# Patient Record
Sex: Male | Born: 1970 | Race: White | Hispanic: No | Marital: Married | Smoking: Never smoker
Health system: Southern US, Community
[De-identification: ages and names within clinical notes are randomized; demographics above are authoritative.]

## PROBLEM LIST (undated history)

## (undated) DIAGNOSIS — N182 Chronic kidney disease, stage 2 (mild): Secondary | ICD-10-CM

## (undated) DIAGNOSIS — C189 Malignant neoplasm of colon, unspecified: Secondary | ICD-10-CM

## (undated) DIAGNOSIS — I10 Essential (primary) hypertension: Secondary | ICD-10-CM

## (undated) DIAGNOSIS — E119 Type 2 diabetes mellitus without complications: Secondary | ICD-10-CM

---

## 2009-08-19 ENCOUNTER — Emergency Department: Payer: Self-pay | Admitting: Internal Medicine

## 2013-01-10 ENCOUNTER — Ambulatory Visit: Payer: Self-pay | Admitting: Physician Assistant

## 2013-01-15 ENCOUNTER — Ambulatory Visit: Payer: Self-pay

## 2013-07-19 ENCOUNTER — Ambulatory Visit: Payer: Self-pay | Admitting: Emergency Medicine

## 2013-07-19 LAB — URINALYSIS, COMPLETE
BACTERIA: NEGATIVE
BILIRUBIN, UR: NEGATIVE
BLOOD: NEGATIVE
GLUCOSE, UR: NEGATIVE mg/dL (ref 0–75)
KETONE: NEGATIVE
Leukocyte Esterase: NEGATIVE
NITRITE: NEGATIVE
Ph: 6 (ref 4.5–8.0)
Protein: NEGATIVE
SPECIFIC GRAVITY: 1.015 (ref 1.003–1.030)
Squamous Epithelial: NONE SEEN

## 2015-03-20 ENCOUNTER — Encounter: Payer: Self-pay | Admitting: Emergency Medicine

## 2015-03-20 ENCOUNTER — Ambulatory Visit (INDEPENDENT_AMBULATORY_CARE_PROVIDER_SITE_OTHER): Payer: BLUE CROSS/BLUE SHIELD

## 2015-03-20 ENCOUNTER — Ambulatory Visit
Admission: EM | Admit: 2015-03-20 | Discharge: 2015-03-20 | Disposition: A | Discharge status: Home / Self Care | Payer: BLUE CROSS/BLUE SHIELD | Attending: Family Medicine | Admitting: Family Medicine

## 2015-03-20 DIAGNOSIS — R030 Elevated blood-pressure reading, without diagnosis of hypertension: Secondary | ICD-10-CM | POA: Diagnosis not present

## 2015-03-20 DIAGNOSIS — S96912A Strain of unspecified muscle and tendon at ankle and foot level, left foot, initial encounter: Secondary | ICD-10-CM

## 2015-03-20 DIAGNOSIS — IMO0001 Reserved for inherently not codable concepts without codable children: Secondary | ICD-10-CM

## 2015-03-20 MED ORDER — HYDROCODONE-ACETAMINOPHEN 5-325 MG PO TABS: 1.0000 | ORAL_TABLET | Freq: Three times a day (TID) | ORAL | Status: DC | PRN

## 2015-03-20 MED ORDER — MELOXICAM 15 MG PO TABS: 15.0000 mg | ORAL_TABLET | Freq: Every day | ORAL | Status: DC

## 2015-03-20 NOTE — Discharge Instructions (Signed)
How to Take Your Blood Pressure HOW DO I GET A BLOOD PRESSURE MACHINE?  You can buy an electronic home blood pressure machine at your local pharmacy. Insurance will sometimes cover the cost if you have a prescription.  Ask your doctor what type of machine is best for you. There are different machines for your arm and your wrist.  If you decide to buy a machine to check your blood pressure on your arm, first check the size of your arm so you can buy the right size cuff. To check the size of your arm:   Use a measuring tape that shows both inches and centimeters.   Wrap the measuring tape around the upper-middle part of your arm. You may need someone to help you measure.   Write down your arm measurement in both inches and centimeters.   To measure your blood pressure correctly, it is important to have the right size cuff.   If your arm is up to 13 inches (up to 34 centimeters), get an adult cuff size.  If your arm is 13 to 17 inches (35 to 44 centimeters), get a large adult cuff size.    If your arm is 17 to 20 inches (45 to 52 centimeters), get an adult thigh cuff.  WHAT DO THE NUMBERS MEAN?   There are two numbers that make up your blood pressure. For example: 120/80.  The first number (120 in our example) is called the "systolic pressure." It is a measure of the pressure in your blood vessels when your heart is pumping blood.  The second number (80 in our example) is called the "diastolic pressure." It is a measure of the pressure in your blood vessels when your heart is resting between beats.  Your doctor will tell you what your blood pressure should be. WHAT SHOULD I DO BEFORE I CHECK MY BLOOD PRESSURE?   Try to rest or relax for at least 30 minutes before you check your blood pressure.  Do not smoke.  Do not have any drinks with caffeine, such as:  Soda.  Coffee.  Tea.  Check your blood pressure in a quiet room.  Sit down and stretch out your arm on a table.  Keep your arm at about the level of your heart. Let your arm relax.  Make sure that your legs are not crossed. HOW DO I CHECK MY BLOOD PRESSURE?  Follow the directions that came with your machine.  Make sure you remove any tight-fitting clothing from your arm or wrist. Wrap the cuff around your upper arm or wrist. You should be able to fit a finger between the cuff and your arm. If you cannot fit a finger between the cuff and your arm, it is too tight and should be removed and rewrapped.  Some units require you to manually pump up the arm cuff.  Automatic units inflate the cuff when you press a button.  Cuff deflation is automatic in both models.  After the cuff is inflated, the unit measures your blood pressure and pulse. The readings are shown on a monitor. Hold still and breathe normally while the cuff is inflated.  Getting a reading takes less than a minute.  Some models store readings in a memory. Some provide a printout of readings. If your machine does not store your readings, keep a written record.  Take readings with you to your next visit with your doctor.   This information is not intended to replace advice given to  you by your health care provider. Make sure you discuss any questions you have with your health care provider.   Document Released: 12/13/2007 Document Revised: 01/20/2014 Document Reviewed: 02/24/2013 Elsevier Interactive Patient Education 2016 Else[employer] Inc[employer]  Heart Disease Prevention Heart disease is a leading cause of death. There are many things you can do to help prevent heart disease. BE PHYSICALLY ACTIVE Physical activity is good for your heart. It helps control your blood pressure, cholesterol levels, and weight. Try to be physically active every day. Ask your health care provider what activities are best for you.  BE A HEALTHY WEIGHT Extra weight can strain your heart and affect your blood pressure and cholesterol levels. Lose weight with diet and  exercise if recommended by your health care provider. EAT HEART-HEALTHY FOODS Follow a healthy eating plan as recommended by your health care provider or dietitian. Heart-healthy foods include:   High-fiber foods. These include oat bran, oatmeal, and whole-grain breads and cereals.  Fruits and vegetables. Avoid:  Alcohol.  Fried foods.  Foods high in saturated fat. These include meats, butter, whole dairy products, shortening, and coconut or palm oil.  Salty foods. These include canned food, luncheon meat, salty snacks, and fast food. KEEP YOUR CHOLESTEROL LEVELS UNDER CONTROL Cholesterol is a substance that is used for many important functions. When your cholesterol levels are high, cholesterol can stick to the insides of your blood vessels, making them narrow or clog. This can lead to chest pain (angina) and a heart attack.  Keep your cholesterol levels under control as recommended by your health care provider. Have your cholesterol checked at least once a year. Target cholesterol levels (in mg/dL) for most people are:   Total cholesterol below 200.  LDL cholesterol below 100.  HDL cholesterol above 40 in men and above 50 in women.  Triglycerides below 150. KEEP YOUR BLOOD PRESSURE UNDER CONTROL Having high blood pressure (hypertension) puts you at risk for stroke and other forms of heart disease. Keep your blood pressure under control as recommended by your health care provider. Ask your health care provider if you need treatment to lower your blood pressure. If you are 67-41 years of age, have your blood pressure checked every 3-5 years. If you are 80 years of age or older, have your blood pressure checked every year. DO NOT USE TOBACCO PRODUCTS Tobacco smoke can damage your heart and blood vessels. Do not use any tobacco products including cigarettes, chewing tobacco, or electronic cigarettes. If you need help quitting, ask your health care provider. TAKE MEDICINES AS  DIRECTED Take medicines only as directed by your health care provider. Ask your health care provider whether you should take an aspirin every day. Taking aspirin can help reduce your risk of heart disease and stroke.  FOR MORE INFORMATION  To find out more about heart disease, visit the American Heart Association's website at www.americanheart.org   This information is not intended to replace advice given to you by your health care provider. Make sure you discuss any questions you have with your health care provider.   Document Released: 08/14/2003 Document Revised: 01/20/2014 Document Reviewed: 02/23/2013 Elsevier Interactive Patient Education 2016 [employer].  Hypertension Hypertension is another name for high blood pressure. High blood pressure forces your heart to work harder to pump blood. A blood pressure reading has two numbers, which includes a higher number over a lower number (example: 110/72). HOME CARE   Have your blood pressure rechecked by your doctor.  Only take medicine  as told by your doctor. Follow the directions carefully. The medicine does not work as well if you skip doses. Skipping doses also puts you at risk for problems.  Do not smoke.  Monitor your blood pressure at home as told by your doctor. GET HELP IF:  You think you are having a reaction to the medicine you are taking.  You have repeat headaches or feel dizzy.  You have puffiness (swelling) in your ankles.  You have trouble with your vision. GET HELP RIGHT AWAY IF:   You get a very bad headache and are confused.  You feel weak, numb, or faint.  You get chest or belly (abdominal) pain.  You throw up (vomit).  You cannot breathe very well. MAKE SURE YOU:   Understand these instructions.  Will watch your condition.  Will get help right away if you are not doing well or get worse.   This information is not intended to replace advice given to you by your health care provider. Make sure you  discuss any questions you have with your health care provider.   Document Released: 06/18/2007 Document Revised: 01/04/2013 Document Reviewed: 10/22/2012 Elsevier Interactive Patient Education 2016 Elsevier Inc.  Cryotherapy Cryotherapy is when you put ice on your injury. Ice helps lessen pain and puffiness (swelling) after an injury. Ice works the best when you start using it in the first 24 to 48 hours after an injury. HOME CARE  Put a dry or damp towel between the ice pack and your skin.  You may press gently on the ice pack.  Leave the ice on for no more than 10 to 20 minutes at a time.  Check your skin after 5 minutes to make sure your skin is okay.  Rest at least 20 minutes between ice pack uses.  Stop using ice when your skin loses feeling (numbness).  Do not use ice on someone who cannot tell you when it hurts. This includes small children and people with memory problems (dementia). GET HELP RIGHT AWAY IF:  You have white spots on your skin.  Your skin turns blue or pale.  Your skin feels waxy or hard.  Your puffiness gets worse. MAKE SURE YOU:   Understand these instructions.  Will watch your condition.  Will get help right away if you are not doing well or get worse.   This information is not intended to replace advice given to you by your health care provider. Make sure you discuss any questions you have with your health care provider.   Document Released: 06/18/2007 Document Revised: 03/24/2011 Document Reviewed: 08/22/2010 Elsevier Interactive Patient Education 2016 [place].  Muscle Strain A muscle strain (pulled muscle) happens when a muscle is stretched beyond normal length. It happens when a sudden, violent force stretches your muscle too far. Usually, a few of the fibers in your muscle are torn. Muscle strain is common in athletes. Recovery usually takes 1-2 weeks. Complete healing takes 5-6 weeks.  HOME CARE   Follow the PRICE method of  treatment to help your injury get better. Do this the first 2-3 days after the injury:  Protect. Protect the muscle to keep it from getting injured again.  Rest. Limit your activity and rest the injured body part.  Ice. Put ice in a plastic bag. Place a towel between your skin and the bag. Then, apply the ice and leave it on from 15-20 minutes each hour. After the third day, switch to moist heat packs.  Compression.  Use a splint or elastic bandage on the injured area for comfort. Do not put it on too tightly.  Elevate. Keep the injured body part above the level of your heart.  Only take medicine as told by your doctor.  Warm up before doing exercise to prevent future muscle strains. GET HELP IF:   You have more pain or puffiness (swelling) in the injured area.  You feel numbness, tingling, or notice a loss of strength in the injured area. MAKE SURE YOU:   Understand these instructions.  Will watch your condition.  Will get help right away if you are not doing well or get worse.   This information is not intended to replace advice given to you by your health care provider. Make sure you discuss any questions you have with your health care provider.   Document Released: 10/09/2007 Document Revised: 10/20/2012 Document Reviewed: 07/29/2012 Elsevier Interactive Patient Education [employer].

## 2015-03-20 NOTE — ED Provider Notes (Signed)
CSN: [id number]     Arrival date & time 03/20/15  1501 History   None   Nurses notes were reviewed. Chief Complaint  Patient presents with  . 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 He has a history of knee surgery before and had some trouble with his gait with that knee surgery. He states he soaked with salt but has continued to bother him and is starting to swell as well.  No significant past medical history and other than knee surgery no pertinent past surgical history he does not smoke. He's had a history of ablation of his blood pressure but states that use his blood pressure comes down with his doctor office so he is not worried about ablation of his blood pressure. (Consider location/radiation/quality/duration/timing/severity/associated sxs/prior Treatment) Patient is a 45 y.o. male presenting with lower extremity pain. The history is provided by the patient.  Foot Pain This is a new problem. The current episode started 6 to 12 hours ago. The problem occurs constantly. The problem has been gradually worsening. Pertinent negatives include no chest pain, no abdominal pain, no headaches and no shortness of breath. Nothing aggravates the symptoms. Nothing relieves the symptoms. He has tried nothing for the symptoms. The treatment provided no relief.    History reviewed. No pertinent past medical history. History reviewed. No pertinent past surgical history. History reviewed. No pertinent family history. Social History  Substance Use Topics  . Smoking status: Never Smoker   . Smokeless tobacco: None  . Alcohol Use: Yes    Review of Systems  Respiratory: Negative for shortness of breath.   Cardiovascular: Negative for chest pain.  Gastrointestinal: Negative for abdominal pain.  Neurological: Negative for headaches.    All other systems reviewed and are negative.   Allergies  Review of patient's allergies indicates no known allergies.  Home Medications   Prior to Admission medications   Medication Sig Start Date End Date Taking? Authorizing Provider  HYDROcodone-acetaminophen (NORCO) 5-325 MG tablet Take 1 tablet by mouth every 8 (eight) hours as needed for moderate pain. 03/20/15   Frederich Cha, MD  meloxicam (MOBIC) 15 MG tablet Take 1 tablet (15 mg total) by mouth daily. 03/20/15   Frederich Cha, MD   Meds Ordered and Administered this Visit  Medications - No data to display  BP 191/118 mmHg  Pulse 98  Temp(Src) 98.5 F (36.9 C) (Oral)  Resp 16  Ht 5\' 7"  (1.702 m)  Wt 230 lb (104.327 kg)  BMI 36.01 kg/m2  SpO2 98% No data found.   Physical Exam  Constitutional: He is oriented to person, place, and time. He appears well-developed and well-nourished.  HENT:  Head: Normocephalic and atraumatic.  Eyes: Conjunctivae are normal. Pupils are equal, round, and reactive to light.  Neck: Normal range of motion. Neck supple.  Musculoskeletal: Normal range of motion. He exhibits tenderness.       Left foot: There is tenderness and swelling. There is no deformity and no laceration.       Feet:  Patient has swelling of the left foot on the left lateral dorsal surface. Over the fifth metatarsal bone. There is a heel spur but is no tenderness over the heel spur which appears to be chronic  Neurological: He is alert and oriented to person,  place, and time.  Skin: Skin is warm. There is erythema.  Psychiatric: He has a normal mood and affect.  Vitals reviewed.   ED Course  Procedures (including critical care time)  Labs Review Labs Reviewed - No data to display  Imaging Review Dg Foot Complete Left  03/20/2015  CLINICAL DATA:  Pain laterally no injury EXAM: LEFT FOOT - COMPLETE 3+ VIEW COMPARISON:  None. FINDINGS: Tarsal-metatarsal alignment is normal. No fracture is seen. Joint spaces appear normal.  No erosion is noted. Only a small plantar calcaneal degenerative spur is seen. IMPRESSION: No acute abnormality.  Small plantar calcaneal degenerative spur. Electronically Signed   By: Ivar Drape M.D.   On: 03/20/2015 17:28     Visual Acuity Review  Right Eye Distance:   Left Eye Distance:   Bilateral Distance:    Right Eye Near:   Left Eye Near:    Bilateral Near:         MDM   1. Strain of foot, left, initial encounter   2. Elevated BP     With the mild swelling present and tenderness will treat as if this is a strain of the left foot still concerned about occult fracture of the minoxidil another week or 2. Recommend boot for support a few Vicodin tabs uses at night for sleep Mobic 15 mg and would recommend follow podiatrist Dr. Vickki Muff locally if his foot still bothers him in 2 weeks and with a boot on a regular basis except when he is in bed.  Note: This dictation was prepared with Dragon dictation along with smaller phrase technology. Any transcriptional errors that result from this process are unintentional.  Frederich Cha, MD 03/20/15 [phone]

## 2015-03-20 NOTE — ED Notes (Signed)
Patient c/o pain on the left side of his left foot that started around 12pm today.  Patient denies injury.  Patient has brusing and swelling at the pain site.

## 2016-01-02 DIAGNOSIS — E1165 Type 2 diabetes mellitus with hyperglycemia: Secondary | ICD-10-CM

## 2016-01-02 DIAGNOSIS — I10 Essential (primary) hypertension: Secondary | ICD-10-CM | POA: Insufficient documentation

## 2016-01-02 DIAGNOSIS — IMO0001 Reserved for inherently not codable concepts without codable children: Secondary | ICD-10-CM | POA: Insufficient documentation

## 2016-07-03 DIAGNOSIS — E7800 Pure hypercholesterolemia, unspecified: Secondary | ICD-10-CM | POA: Insufficient documentation

## 2016-08-01 DIAGNOSIS — R2232 Localized swelling, mass and lump, left upper limb: Secondary | ICD-10-CM | POA: Insufficient documentation

## 2016-08-28 ENCOUNTER — Ambulatory Visit: Admit: 2016-08-28 | Payer: BLUE CROSS/BLUE SHIELD | Admitting: Otolaryngology

## 2016-08-28 SURGERY — NASOPHARYNGOSCOPY, WITH EUSTACHIAN TUBE DILATION USING BALLOON
Anesthesia: General | Laterality: Left

## 2016-10-31 IMAGING — CR DG FOOT COMPLETE 3+V*L*
3 series · 3 of 3 positions shown · non-contrast
Comparison: None.

CLINICAL DATA: Pain laterally no injury

EXAM:
LEFT FOOT - COMPLETE 3+ VIEW

[foot ap]
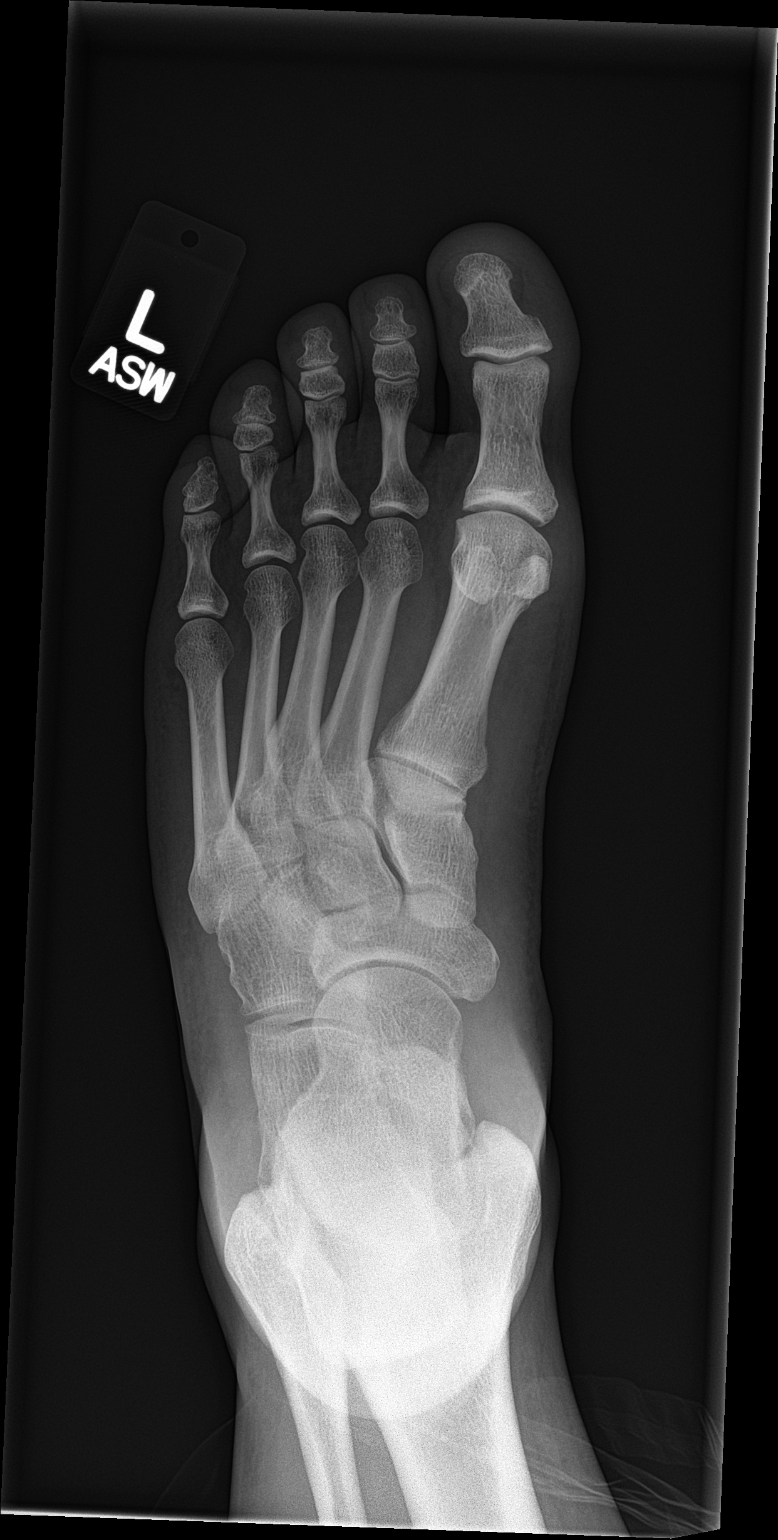

[foot obl]
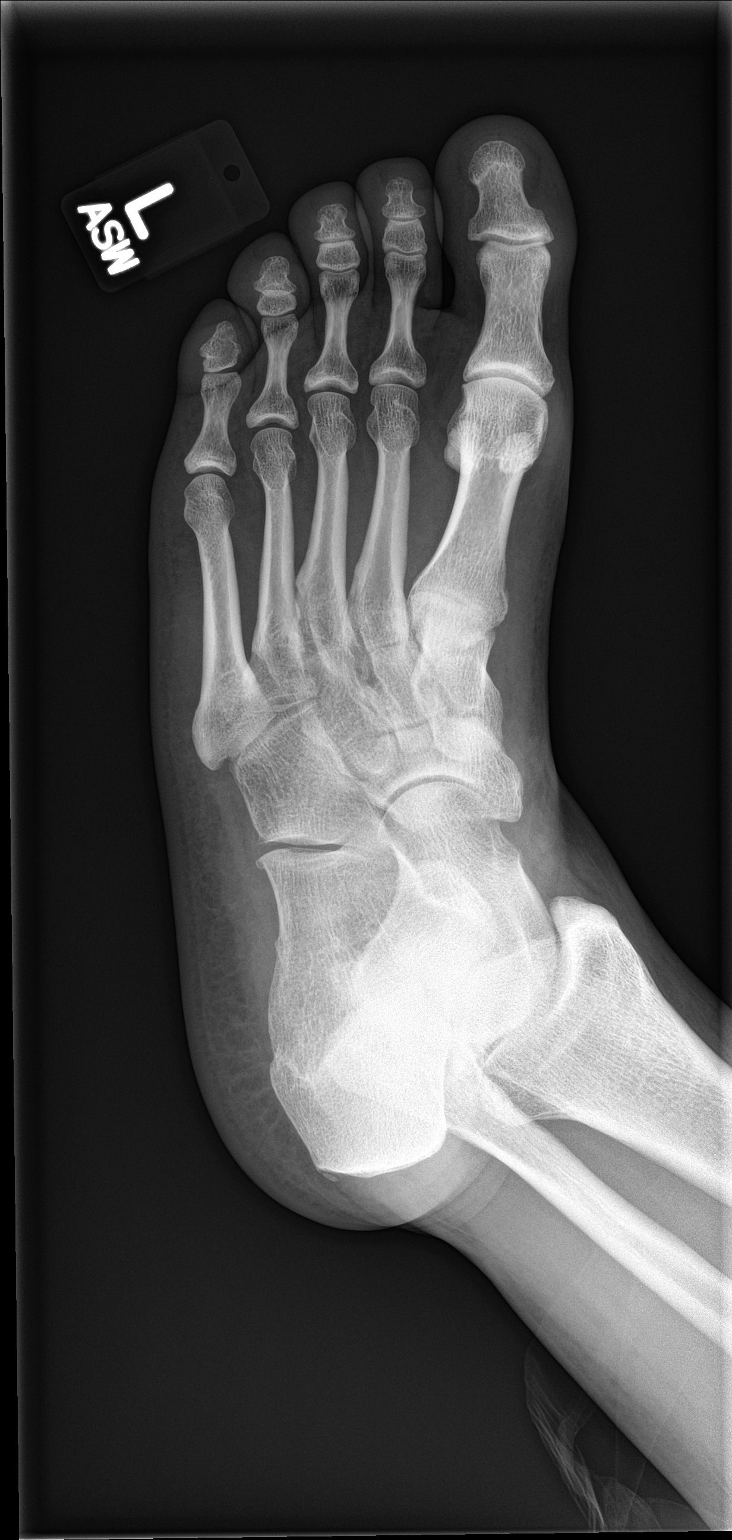

[foot lat]
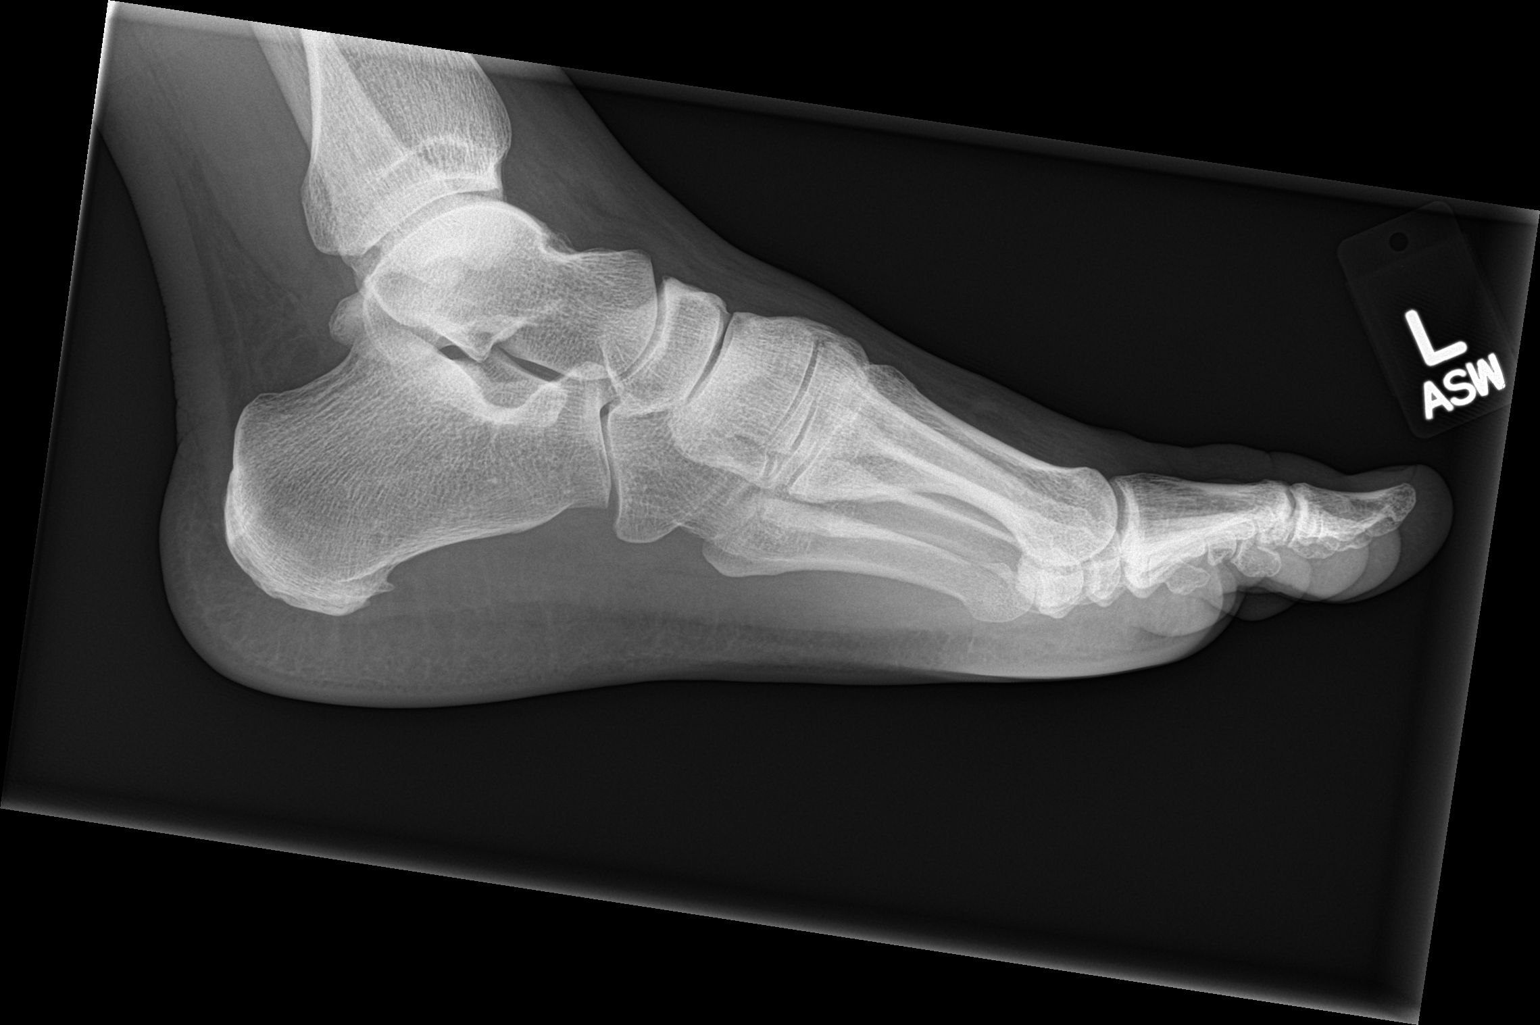

[3 of 3 positions shown; findings below may reference images not displayed]

FINDINGS: Tarsal-metatarsal alignment is normal. No fracture is seen. Joint
spaces appear normal. No erosion is noted. Only a small plantar
calcaneal degenerative spur is seen.
IMPRESSION: No acute abnormality.  Small plantar calcaneal degenerative spur.

## 2017-01-13 DIAGNOSIS — C189 Malignant neoplasm of colon, unspecified: Secondary | ICD-10-CM | POA: Diagnosis present

## 2017-05-26 ENCOUNTER — Ambulatory Visit: Payer: Self-pay

## 2017-05-26 ENCOUNTER — Other Ambulatory Visit: Payer: Self-pay | Admitting: Occupational Medicine

## 2017-05-26 DIAGNOSIS — Z0000 Encounter for general adult medical examination without abnormal findings: Secondary | ICD-10-CM

## 2017-12-24 ENCOUNTER — Other Ambulatory Visit: Payer: Self-pay | Admitting: Gastroenterology

## 2017-12-24 DIAGNOSIS — K6389 Other specified diseases of intestine: Secondary | ICD-10-CM

## 2017-12-28 ENCOUNTER — Other Ambulatory Visit: Payer: Self-pay

## 2017-12-28 ENCOUNTER — Inpatient Hospital Stay
Admission: EM | Admit: 2017-12-28 | Discharge: 2018-01-04 | DRG: 329 | Disposition: A | Discharge status: Home / Self Care | Payer: BLUE CROSS/BLUE SHIELD | Attending: Surgery | Admitting: Surgery

## 2017-12-28 ENCOUNTER — Telehealth: Payer: Self-pay

## 2017-12-28 ENCOUNTER — Emergency Department: Payer: BLUE CROSS/BLUE SHIELD

## 2017-12-28 ENCOUNTER — Encounter: Payer: Self-pay | Admitting: Emergency Medicine

## 2017-12-28 DIAGNOSIS — I129 Hypertensive chronic kidney disease with stage 1 through stage 4 chronic kidney disease, or unspecified chronic kidney disease: Secondary | ICD-10-CM | POA: Diagnosis present

## 2017-12-28 DIAGNOSIS — K5669 Other partial intestinal obstruction: Secondary | ICD-10-CM

## 2017-12-28 DIAGNOSIS — Z791 Long term (current) use of non-steroidal anti-inflammatories (NSAID): Secondary | ICD-10-CM | POA: Diagnosis not present

## 2017-12-28 DIAGNOSIS — K651 Peritoneal abscess: Secondary | ICD-10-CM | POA: Diagnosis present

## 2017-12-28 DIAGNOSIS — K529 Noninfective gastroenteritis and colitis, unspecified: Secondary | ICD-10-CM | POA: Diagnosis present

## 2017-12-28 DIAGNOSIS — E1122 Type 2 diabetes mellitus with diabetic chronic kidney disease: Secondary | ICD-10-CM | POA: Diagnosis present

## 2017-12-28 DIAGNOSIS — Z79899 Other long term (current) drug therapy: Secondary | ICD-10-CM | POA: Diagnosis not present

## 2017-12-28 DIAGNOSIS — C187 Malignant neoplasm of sigmoid colon: Principal | ICD-10-CM

## 2017-12-28 DIAGNOSIS — N182 Chronic kidney disease, stage 2 (mild): Secondary | ICD-10-CM | POA: Diagnosis present

## 2017-12-28 DIAGNOSIS — K210 Gastro-esophageal reflux disease with esophagitis: Secondary | ICD-10-CM | POA: Diagnosis present

## 2017-12-28 DIAGNOSIS — C189 Malignant neoplasm of colon, unspecified: Secondary | ICD-10-CM | POA: Diagnosis present

## 2017-12-28 HISTORY — DX: Chronic kidney disease, stage 2 (mild): N18.2

## 2017-12-28 HISTORY — DX: Type 2 diabetes mellitus without complications: E11.9

## 2017-12-28 HISTORY — DX: Malignant neoplasm of colon, unspecified: C18.9

## 2017-12-28 HISTORY — DX: Essential (primary) hypertension: I10

## 2017-12-28 LAB — COMPREHENSIVE METABOLIC PANEL
ALBUMIN: 3.7 g/dL (ref 3.5–5.0)
ALT: 20 U/L (ref 0–44)
AST: 19 U/L (ref 15–41)
Alkaline Phosphatase: 75 U/L (ref 38–126)
Anion gap: 10 (ref 5–15)
BUN: 15 mg/dL (ref 6–20)
CHLORIDE: 98 mmol/L (ref 98–111)
CO2: 22 mmol/L (ref 22–32)
Calcium: 8.5 mg/dL — ABNORMAL LOW (ref 8.9–10.3)
Creatinine, Ser: 1.25 mg/dL — ABNORMAL HIGH (ref 0.61–1.24)
GFR calc Af Amer: >60 mL/min (ref >60)
GFR calc non Af Amer: >60 mL/min (ref >60)
Glucose, Bld: 213 mg/dL — ABNORMAL HIGH (ref 70–99)
Potassium: 3.8 mmol/L (ref 3.5–5.1)
Sodium: 130 mmol/L — ABNORMAL LOW (ref 135–145)
Total Bilirubin: 0.9 mg/dL (ref 0.3–1.2)
Total Protein: 8.7 g/dL — ABNORMAL HIGH (ref 6.5–8.1)

## 2017-12-28 LAB — URINALYSIS, COMPLETE (UACMP) WITH MICROSCOPIC
Bacteria, UA: NONE SEEN
Bilirubin Urine: NEGATIVE
Glucose, UA: NEGATIVE mg/dL
Hgb urine dipstick: NEGATIVE
Ketones, ur: NEGATIVE mg/dL
Leukocytes, UA: NEGATIVE
Nitrite: NEGATIVE
Protein, ur: 30 mg/dL — AB
SQUAMOUS EPITHELIAL / LPF: NONE SEEN (ref 0–5)
Specific Gravity, Urine: 1.023 (ref 1.005–1.030)
pH: 5 (ref 5.0–8.0)

## 2017-12-28 LAB — CBC
HEMATOCRIT: 42 % (ref 39.0–52.0)
Hemoglobin: 13.9 g/dL (ref 13.0–17.0)
MCH: 28.3 pg (ref 26.0–34.0)
MCHC: 33.1 g/dL (ref 30.0–36.0)
MCV: 85.5 fL (ref 80.0–100.0)
Platelets: 347 10*3/uL (ref 150–400)
RBC: 4.91 MIL/uL (ref 4.22–5.81)
RDW: 11.8 % (ref 11.5–15.5)
WBC: 8.8 10*3/uL (ref 4.0–10.5)
nRBC: 0 % (ref 0.0–0.2)

## 2017-12-28 LAB — LIPASE, BLOOD: Lipase: 31 U/L (ref 11–51)

## 2017-12-28 LAB — GLUCOSE, CAPILLARY: Glucose-Capillary: 271 mg/dL — ABNORMAL HIGH (ref 70–99)

## 2017-12-28 MED ORDER — HYDROMORPHONE HCL 1 MG/ML IJ SOLN
1.0000 mg | Freq: Once | INTRAMUSCULAR | Status: AC
  Administered 2017-12-28: 1 mg via INTRAVENOUS

## 2017-12-28 MED ORDER — HYDROMORPHONE HCL 1 MG/ML IJ SOLN
1.0000 mg | Freq: Once | INTRAMUSCULAR | Status: AC
  Administered 2017-12-28: 1 mg via INTRAVENOUS
  Filled 2017-12-28: qty 1

## 2017-12-28 MED ORDER — HYDROMORPHONE HCL 1 MG/ML PO LIQD: 1.0000 mg | Freq: Once | ORAL | Status: DC

## 2017-12-28 MED ORDER — HYDROCODONE-ACETAMINOPHEN 5-325 MG PO TABS: 1.0000 | ORAL_TABLET | ORAL | Status: DC | PRN

## 2017-12-28 MED ORDER — IOPAMIDOL (ISOVUE-300) INJECTION 61%
15.0000 mL | INTRAVENOUS | Status: DC
  Administered 2017-12-28: 15 mL via ORAL

## 2017-12-28 MED ORDER — LISINOPRIL 20 MG PO TABS
20.0000 mg | ORAL_TABLET | Freq: Every day | ORAL | Status: DC
  Administered 2017-12-29 – 2018-01-04 (×6): 20 mg via ORAL
  Filled 2017-12-28 (×6): qty 1

## 2017-12-28 MED ORDER — INSULIN ASPART 100 UNIT/ML ~~LOC~~ SOLN
0.0000 [IU] | Freq: Every day | SUBCUTANEOUS | Status: DC
  Administered 2017-12-28 – 2017-12-30 (×2): 3 [IU] via SUBCUTANEOUS
  Filled 2017-12-28 (×2): qty 1

## 2017-12-28 MED ORDER — IOPAMIDOL (ISOVUE-300) INJECTION 61%
100.0000 mL | Freq: Once | INTRAVENOUS | Status: AC | PRN
  Administered 2017-12-28: 100 mL via INTRAVENOUS

## 2017-12-28 MED ORDER — KETOROLAC TROMETHAMINE 15 MG/ML IJ SOLN
15.0000 mg | Freq: Four times a day (QID) | INTRAMUSCULAR | Status: AC
  Administered 2017-12-28: 15 mg via INTRAVENOUS
  Filled 2017-12-28: qty 1

## 2017-12-28 MED ORDER — ACETAMINOPHEN 500 MG PO TABS
1000.0000 mg | ORAL_TABLET | Freq: Four times a day (QID) | ORAL | Status: DC | PRN
  Administered 2017-12-28 – 2017-12-29 (×3): 1000 mg via ORAL
  Filled 2017-12-28 (×3): qty 2

## 2017-12-28 MED ORDER — KETOROLAC TROMETHAMINE 15 MG/ML IJ SOLN
15.0000 mg | Freq: Four times a day (QID) | INTRAMUSCULAR | Status: DC | PRN
  Administered 2017-12-29 (×2): 15 mg via INTRAVENOUS
  Filled 2017-12-28 (×2): qty 1

## 2017-12-28 MED ORDER — INSULIN ASPART 100 UNIT/ML ~~LOC~~ SOLN
0.0000 [IU] | Freq: Three times a day (TID) | SUBCUTANEOUS | Status: DC
  Administered 2017-12-29: 8 [IU] via SUBCUTANEOUS
  Administered 2017-12-29: 3 [IU] via SUBCUTANEOUS
  Administered 2017-12-29: 5 [IU] via SUBCUTANEOUS
  Administered 2017-12-30 (×3): 3 [IU] via SUBCUTANEOUS
  Administered 2017-12-31: 8 [IU] via SUBCUTANEOUS
  Filled 2017-12-28 (×7): qty 1

## 2017-12-28 MED ORDER — LACTATED RINGERS IV SOLN
INTRAVENOUS | Status: DC
  Administered 2017-12-28 – 2017-12-29 (×4): via INTRAVENOUS

## 2017-12-28 MED ORDER — ONDANSETRON HCL 4 MG/2ML IJ SOLN
4.0000 mg | Freq: Once | INTRAMUSCULAR | Status: AC
  Administered 2017-12-28: 4 mg via INTRAVENOUS
  Filled 2017-12-28: qty 2

## 2017-12-28 MED ORDER — SODIUM CHLORIDE 0.9 % IV BOLUS
1000.0000 mL | Freq: Once | INTRAVENOUS | Status: AC
  Administered 2017-12-28: 1000 mL via INTRAVENOUS

## 2017-12-28 MED ORDER — MORPHINE SULFATE (PF) 2 MG/ML IV SOLN: 2.0000 mg | INTRAVENOUS | Status: DC | PRN

## 2017-12-28 MED ORDER — PIPERACILLIN-TAZOBACTAM 3.375 G IVPB 30 MIN
3.3750 g | Freq: Once | INTRAVENOUS | Status: AC
  Administered 2017-12-28: 3.375 g via INTRAVENOUS
  Filled 2017-12-28: qty 50

## 2017-12-28 MED ORDER — METOPROLOL TARTRATE 5 MG/5ML IV SOLN: 5.0000 mg | Freq: Four times a day (QID) | INTRAVENOUS | Status: DC | PRN

## 2017-12-28 MED ORDER — ENOXAPARIN SODIUM 40 MG/0.4ML ~~LOC~~ SOLN
40.0000 mg | SUBCUTANEOUS | Status: DC
  Administered 2017-12-28 – 2017-12-30 (×3): 40 mg via SUBCUTANEOUS
  Filled 2017-12-28 (×3): qty 0.4

## 2017-12-28 MED ORDER — HYDROMORPHONE HCL 1 MG/ML IJ SOLN
INTRAMUSCULAR | Status: AC
  Administered 2017-12-28: 1 mg via INTRAVENOUS
  Filled 2017-12-28: qty 1

## 2017-12-28 MED ORDER — PIPERACILLIN-TAZOBACTAM 3.375 G IVPB
3.3750 g | Freq: Three times a day (TID) | INTRAVENOUS | Status: DC
  Administered 2017-12-28 – 2018-01-03 (×16): 3.375 g via INTRAVENOUS
  Filled 2017-12-28 (×17): qty 50

## 2017-12-28 NOTE — ED Triage Notes (Signed)
C/O upper abdominal pain x 30 minutes.  Had a colonoscopy last week and was diagnosed with colon ca.  No vomiting, but feeling nauseated.

## 2017-12-28 NOTE — ED Notes (Signed)
General Surgery to bedside at this time.   

## 2017-12-28 NOTE — Progress Notes (Signed)
Patient's fever is now 102.1.  Dr [name] informed.  No change or new orders at this time

## 2017-12-28 NOTE — H&P (Addendum)
SURGICAL HISTORY & PHYSICAL (cpt [phone])  Patient seen and examined as described below with surgical PA-C, Ardell Isaacs.  Assessment/Plan: (ICD-10's: C18.9) In summary, patient is a 47 y.o. 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    - will treat colitis similar to sigmoid colonic diverticulitis with IV antibiotics for now  - IV antibiotics (Zosyn), clear liquids diet, pain control prn (minimize narcotics as able), IV fluids  - monitor abdominal exam and bowel function, will discuss degree of obstruction with Dr. Angelica Chessman  - discussed with patient risks and benefits of partial colectomy +/- colostomy this admission vs outpatient (patient's expressed preference), and patient expresses understanding  - medical management of comorbidities, follow-up/trend am WBC  - DVT prophylaxis, ambulation encouraged  I have personally reviewed the patient's chart, evaluated/examined the patient, proposed the recommended management, and discussed these recommendations with the patient and his family to their expressed satisfaction as well as with patient's RN ED physician.  Thank you for the opportunity to participate in this patient's care.  -- [name] [name], MD, [place]: [facility] - Partnering for exceptional care. Office: [phone]  [facility] SURGICAL HISTORY & PHYSICAL (cpt [phone])  HISTORY OF PRESENT ILLNESS (HPI):  47 y.o. male presented to [facility] ED today (12/28/17) for severe abdominal pain. Patient reports that around 10 AM this morning he noticed the acute onset of abdominal pain in his left upper and lower abdomen. He described the pain as sharp and aching which does  not radiate. He does endorse associated nausea but denied any weight loss, fever, chills, CP, SOB, emesis, or acute bowel changes. Of note, he underwent colonoscopy on 12/11 with Dr Gustavo Lah which showed invasive adenocarcinoma the distal left colon after he presented with bright red blood per rectum. He is unsure of any family history of colon cancer because his parents are adopted however he believes his maternal aunt may have been diagnosed with colon cancer at 81. Work up in the ED revealed concern for sigmoid colon inflammatory changes and free fluid near his known sigmoid mass.   General Surgery is consulted by emergency medicine physician Dr Carrie Mew, MD for evaluation and management of inflammatory bowel changes and sigmoid colon cancer.   PAST MEDICAL HISTORY (PMH):  Past Medical History:  Diagnosis Date  . CKD (chronic kidney disease), stage II   . Colon cancer ([place])   . Diabetes mellitus without complication ([place])   . Hypertension     Reviewed. Otherwise negative.   PAST SURGICAL HISTORY ([place]):  History reviewed. No pertinent surgical history.  Reviewed. Otherwise negative.   MEDICATIONS:  Prior to Admission medications   Medication Sig Start Date End Date Taking? Authorizing Provider  lisinopril (PRINIVIL,ZESTRIL) 20 MG tablet Take 20 mg by mouth daily.   Yes [provider]  HYDROcodone-acetaminophen (NORCO) 5-325 MG tablet Take 1 tablet by mouth every 8 (eight) hours as needed for moderate pain. Patient not taking: Reported on 12/28/2017 03/20/15   Frederich Cha, MD  meloxicam (MOBIC) 15 MG tablet Take 1 tablet (15 mg total) by mouth daily. Patient not taking: Reported on 12/28/2017 03/20/15   Frederich Cha, MD    ALLERGIES:  No Known Allergies   SOCIAL HISTORY:  Social History   Socioeconomic History  . Marital status: Married  Spouse name: Not on file  . Number of children: Not on file  . Years of education: Not on file  . Highest education level:  Not on file  Occupational History  . Not on file  Social Needs  . Financial resource strain: Not on file  . Food insecurity:    Worry: Not on file    Inability: Not on file  . Transportation needs:    Medical: Not on file    Non-medical: Not on file  Tobacco Use  . Smoking status: Never Smoker  . Smokeless tobacco: Never Used  Substance and Sexual Activity  . Alcohol use: Yes  . Drug use: Not on file  . Sexual activity: Not on file  Lifestyle  . Physical activity:    Days per week: Not on file    Minutes per session: Not on file  . Stress: Not on file  Relationships  . Social connections:    Talks on phone: Not on file    Gets together: Not on file    Attends religious service: Not on file    Active member of club or organization: Not on file    Attends meetings of clubs or organizations: Not on file    Relationship status: Not on file  . Intimate partner violence:    Fear of current or ex partner: Not on file    Emotionally abused: Not on file    Physically abused: Not on file    Forced sexual activity: Not on file  Other Topics Concern  . Not on file  Social History Narrative  . Not on file    FAMILY HISTORY:  No family history on file.  Otherwise negative.   REVIEW OF SYSTEMS:  Review of Systems  Constitutional: Negative for chills, fever and weight loss.  Respiratory: Negative for cough and shortness of breath.   Cardiovascular: Negative for chest pain and palpitations.  Gastrointestinal: Positive for abdominal pain, blood in stool and nausea. Negative for heartburn and vomiting.  Genitourinary: Negative for dysuria and urgency.  Musculoskeletal: Negative for falls and myalgias.  Neurological: Negative for dizziness and headaches.  All other systems reviewed and are negative.  VITAL SIGNS:  Temp:  [98 F (36.7 C)] 98 F (36.7 C) (12/16 1035) Pulse Rate:  [73-115] 115 (12/16 1413) Resp:  [18-30] 18 (12/16 1413) BP: (105-125)/(55-65) 112/64 (12/16  1413) SpO2:  [91 %-100 %] 95 % (12/16 1413) Weight:  [104.3 kg] 104.3 kg (12/16 1033)       Weight: 104.3 kg     PHYSICAL EXAM:  Physical Exam Constitutional:      General: He is not in acute distress.    Appearance: He is well-developed. He is obese. He is not ill-appearing.  HENT:     Head: Normocephalic and atraumatic.  Eyes:     Extraocular Movements: Extraocular movements intact.     Pupils: Pupils are equal, round, and reactive to light.  Cardiovascular:     Rate and Rhythm: Regular rhythm. Tachycardia present.     Heart sounds: Normal heart sounds. No murmur. No friction rub. No gallop.   Pulmonary:     Effort: Pulmonary effort is normal. No respiratory distress.     Breath sounds: Normal breath sounds. No wheezing or rhonchi.     Comments: On 2L [place] Abdominal:     General: Abdomen is protuberant. There is no distension.     Palpations: Abdomen is soft. There is no mass.     Tenderness: There  is abdominal tenderness in the left lower quadrant. There is no guarding or rebound.     Hernia: No hernia is present.  Genitourinary:    Comments: Deferred Musculoskeletal:     Comments: No peripheral edema No Calf Tenderness  Skin:    General: Skin is warm and dry.     Coloration: Skin is not jaundiced.  Neurological:     General: No focal deficit present.     Mental Status: He is alert and oriented to person, place, and time.  Psychiatric:        Mood and Affect: Mood normal.        Behavior: Behavior normal.   INTAKE/OUTPUT:  This shift: Total I/O In: 1000 [IV Piggyback:1000] Out: -   Last 2 shifts: @IOLAST2SHIFTS @  Labs:  CBC Latest Ref Rng & Units 12/28/2017  WBC 4.0 - 10.5 K/uL 8.8  Hemoglobin 13.0 - 17.0 g/dL 13.9  Hematocrit 39.0 - 52.0 % 42.0  Platelets 150 - 400 K/uL 347   CMP Latest Ref Rng & Units 12/28/2017  Glucose 70 - 99 mg/dL 213(H)  BUN 6 - 20 mg/dL 15  Creatinine 0.61 - 1.24 mg/dL 1.25(H)  Sodium 135 - 145 mmol/L 130(L)  Potassium 3.5 - 5.1  mmol/L 3.8  Chloride 98 - 111 mmol/L 98  CO2 22 - 32 mmol/L 22  Calcium 8.9 - 10.3 mg/dL 8.5(L)  Total Protein 6.5 - 8.1 g/dL 8.7(H)  Total Bilirubin 0.3 - 1.2 mg/dL 0.9  Alkaline Phos 38 - 126 U/L 75  AST 15 - 41 U/L 19  ALT 0 - 44 U/L 20   Imaging studies:  CT Chest/Abdomen/Pelvis (12/28/2017): Acute inflammatory changes and free fluid associated with the sigmoid colon in the mesentery, centered at the site of the sigmoid colon mass, which is reported to have been recently biopsied during colonoscopy. The differential includes complicating features of colonoscopy/biopsy, infectious/inflammatory colitis, or potentially a perforating colon cancer which pre-existed the recent colonoscopy.  CT demonstrates circumferential thickening of the sigmoid colon, compatible with colon cancer as is the given history. There are multiple small nodules/lymph nodes of the sigmoid mesentery which extend along the inferior mesenteric neurovascular drainage pathway. These may be pathologic with lymphatic extension versus reactive/inflammatory changes. Correlation with staging PET-CT may be useful.  Borderline dilated small bowel with air-fluid levels, favored to represent reactive ileus given the degree of inflammation of the mesentery, and the absence of a discrete transition point.    Assessment/Plan: (ICD-10's: C8.9) 47 y.o. male with suspected microperforation and inflammatory changes of his sigmoid colon most likely related to his recently diagnosed invasive adenocarcinoma of his sigmoid colon by colonoscopy on 12/11 (Dr Gustavo Lah), complicated by pertinent comorbidities including Stage II CKD, HTN, and DM.    - Admit to general surgery  - Clear liquid diet  - Start IV ABx (Zosyn), IVF  - Pain control as needed  - Morning labs (CBC, BMP)   - Monitor abdominal examination and on-going bowel function  - Will discuss colonoscopy findings with Dr Gustavo Lah to determine degree of questionable  obstruction  -  At some point, this patient will require colectomy with possible primary anastomosis vs colostomy whether its during this admission or an outpatient. Patient voiced understanding of this.   - Medical management of comorbidities  - Mobilize  - DVT prophylaxis  All of the above findings and recommendations were discussed with the patient and his/her family, and all of his and his family's questions were answered to their expressed satisfaction.  --  [name], PA-C [facility] 12/28/2017, 2:52 PM [phone] M-F: 7am - 4pm

## 2017-12-28 NOTE — ED Provider Notes (Signed)
[facility] Emergency Department Provider Note  ____________________________________________  Time seen: Approximately 11:01 AM  I have reviewed the triage vital signs and the nursing notes.   HISTORY  Chief Complaint Abdominal Pain    HPI Adam Farrell is a 47 y.o. 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  He was at work doing usual activities at the time when the onset.  Nonradiating, no aggravating or alleviating factors.  Nauseated but no vomiting.  No diarrhea or constipation or bloody stool.  Denies fever chills chest pain or shortness of breath.  Pain is constant without aggravating or alleviating factors, feels achy.     Past Medical History:  Diagnosis Date  . Colon cancer ([name])   . Diabetes mellitus without complication ([name])   . Hypertension      There are no active problems to display for this patient.    History reviewed. No pertinent surgical history.   Prior to Admission medications   Medication Sig Start Date End Date Taking? Authorizing Provider  lisinopril (PRINIVIL,ZESTRIL) 20 MG tablet Take 20 mg by mouth daily.   Yes [provider]  HYDROcodone-acetaminophen (NORCO) 5-325 MG tablet Take 1 tablet by mouth every 8 (eight) hours as needed for moderate pain. Patient not taking: Reported on 12/28/2017 03/20/15   Frederich Cha, MD  meloxicam (MOBIC) 15 MG tablet Take 1 tablet (15 mg total) by mouth daily. Patient not taking: Reported on 12/28/2017 03/20/15   Frederich Cha, MD     Allergies Patient has no known allergies.   No family history on file.  Social History Social History   Tobacco Use  . Smoking status: Never Smoker  . Smokeless tobacco: Never Used  Substance Use Topics  . Alcohol use: Yes  . Drug use: Not on file    Review of  Systems  Constitutional:   No fever or chills.  ENT:   No sore throat. No rhinorrhea. Cardiovascular:   No chest pain or syncope. Respiratory:   No dyspnea or cough. Gastrointestinal: Positive as above abdominal pain without vomiting and diarrhea.  Musculoskeletal:   Negative for focal pain or swelling All other systems reviewed and are negative except as documented above in ROS and HPI.  ____________________________________________   PHYSICAL EXAM:  VITAL SIGNS: ED Triage Vitals  Enc Vitals Group     BP 12/28/17 1035 105/65     Pulse Rate 12/28/17 1035 73     Resp 12/28/17 1035 20     Temp 12/28/17 1035 98 F (36.7 C)     Temp Source 12/28/17 1035 Oral     SpO2 12/28/17 1035 100 %     Weight 12/28/17 1033 229 lb 15 oz (104.3 kg)     Height --      Head Circumference --      Peak Flow --      Pain Score 12/28/17 1033 10     Pain Loc --      Pain Edu? --      Excl. in [place]? --     Vital signs reviewed, nursing assessments reviewed.   Constitutional:   Alert and oriented. Non-toxic appearance. Eyes:   Conjunctivae are normal. EOMI. PERRL. ENT      Head:   Normocephalic and atraumatic.      Nose:   No congestion/rhinnorhea.  Mouth/Throat:   Dry mucous membranes, no pharyngeal erythema. No peritonsillar mass.       Neck:   No meningismus. Full ROM. Hematological/Lymphatic/Immunilogical:   No cervical lymphadenopathy. Cardiovascular:   RRR. Symmetric bilateral radial and DP pulses.  No murmurs. Cap refill less than 2 seconds. Respiratory:   Normal respiratory effort without tachypnea/retractions. Breath sounds are clear and equal bilaterally. No wheezes/rales/rhonchi. Gastrointestinal:   Abdomen is distended but not rigid.  He has generalized tenderness, worse in the left upper quadrant and epigastrium.  There is rebound tenderness.  Abdomen is not tympanitic. Musculoskeletal:   Normal range of motion in all extremities. No joint effusions.  No lower extremity  tenderness.  No edema. Neurologic:   Normal speech and language.  Motor grossly intact. No acute focal neurologic deficits are appreciated.  Skin:    Skin is warm, dry and intact. No rash noted.  No petechiae, purpura, or bullae.  ____________________________________________    LABS (pertinent positives/negatives) (all labs ordered are listed, but only abnormal results are displayed) Labs Reviewed  COMPREHENSIVE METABOLIC PANEL - Abnormal; Notable for the following components:      Result Value   Sodium 130 (*)    Glucose, Bld 213 (*)    Creatinine, Ser 1.25 (*)    Calcium 8.5 (*)    Total Protein 8.7 (*)    All other components within normal limits  URINALYSIS, COMPLETE (UACMP) WITH MICROSCOPIC - Abnormal; Notable for the following components:   Color, Urine AMBER (*)    APPearance HAZY (*)    Protein, ur 30 (*)    All other components within normal limits  LIPASE, BLOOD  CBC   ____________________________________________   EKG  Interpreted by me  Date: 12/28/2017  Rate: 87  Rhythm: normal sinus rhythm  QRS Axis: normal  Intervals: normal  ST/T Wave abnormalities: normal  Conduction Disutrbances: none  Narrative Interpretation: unremarkable      ____________________________________________    RADIOLOGY  Ct Chest W Contrast  Result Date: 12/28/2017 CLINICAL DATA:  47 year old male with abdominal pain. Recent colonoscopy with diagnosis of colon cancer/mass EXAM: CT CHEST, ABDOMEN, AND PELVIS WITH CONTRAST TECHNIQUE: Multidetector CT imaging of the chest, abdomen and pelvis was performed following the standard protocol during bolus administration of intravenous contrast. CONTRAST:  15mL ISOVUE-300 IOPAMIDOL (ISOVUE-300) INJECTION 61% COMPARISON:  CT 08/19/2009 FINDINGS: CT CHEST FINDINGS Cardiovascular: Heart size within normal limits. No pericardial fluid/thickening. Unremarkable course caliber and contour of the thoracic aorta. No significant atherosclerotic  changes. Mediastinum/Nodes: Small lymph nodes of the mediastinum, none of which are enlarged. No available for comparison. Unremarkable appearance of the thoracic esophagus. Central airways are patent with no debris. Unremarkable appearance of the thoracic inlet. No supraclavicular or axillary adenopathy. Lungs/Pleura: No pneumothorax or pleural effusion. No confluent airspace disease. Musculoskeletal: No acute displaced fracture. No aggressive sclerotic or lytic lesions. No bony canal narrowing. CT ABDOMEN PELVIS FINDINGS Hepatobiliary: Coarse calcification of the right liver, which is otherwise unremarkable. Unremarkable appearance of the gallbladder. Pancreas: Unremarkable pancreas Spleen: Unremarkable spleen Adrenals/Urinary Tract: Unremarkable appearance of the adrenal glands. No evidence of hydronephrosis of the right or left kidney. No nephrolithiasis. Unremarkable course of the bilateral ureters. Unremarkable appearance of the urinary bladder. Stomach/Bowel: Unremarkable stomach. Proximal small bowel unremarkable. There is dilated distal jejunum and ileum with air-fluid levels. Enteric contrast does not reach the distal ileum. The dilated small bowel is in the region of the left abdomen with inflammatory changes of the fat and interloop fluid. Mild focal  wall thickening in a short segment of small bowel in the mid abdomen (image 94 series 3). Small bowel wall appears to enhance normally without focal thickening. Circumferential sigmoid colonic wall thickening on image 101 of series 3. Small volume of low-density free fluid adjacent to the sigmoid colon at this site, within the left pericolic gutter, and extending into the dependent pelvis in the rectovesical space. No free air or rim enhancing focal fluid. There are multiple nodules/lymph nodes within the mesenteric fat of the sigmoid colon, and extending in the drainage pathway of the inferior mesenteric neurovascular bundle. Multiple small nodules/lymph  nodes of small bowel mesentery, many of which were present on the comparison CT of 2011. Normal appendix. Vascular/Lymphatic: No significant atherosclerotic calcifications. Proximal femoral arteries are patent. Reproductive: Calcifications of the prostate. Transverse diameter prostate measures 3.6 cm. Other: Fat containing left inguinal hernia Musculoskeletal: No acute displaced fracture. No aggressive bony or lytic lesions. IMPRESSION: Acute inflammatory changes and free fluid associated with the sigmoid colon in the mesentery, centered at the site of the sigmoid colon mass, which is reported to have been recently biopsied during colonoscopy. The differential includes complicating features of colonoscopy/biopsy, infectious/inflammatory colitis, or potentially a perforating colon cancer which pre-existed the recent colonoscopy. CT demonstrates circumferential thickening of the sigmoid colon, compatible with colon cancer as is the given history. There are multiple small nodules/lymph nodes of the sigmoid mesentery which extend along the inferior mesenteric neurovascular drainage pathway. These may be pathologic with lymphatic extension versus reactive/inflammatory changes. Correlation with staging PET-CT may be useful. Borderline dilated small bowel with air-fluid levels, favored to represent reactive ileus given the degree of inflammation of the mesentery, and the absence of a discrete transition point. These results were called by telephone at the time of interpretation on 12/28/2017 at 1:57 pm to Dr. Carrie Mew. Electronically Signed   By: Corrie Mckusick D.O.   On: 12/28/2017 13:58   Ct Abdomen Pelvis W Contrast  Result Date: 12/28/2017 CLINICAL DATA:  47 year old male with abdominal pain. Recent colonoscopy with diagnosis of colon cancer/mass EXAM: CT CHEST, ABDOMEN, AND PELVIS WITH CONTRAST TECHNIQUE: Multidetector CT imaging of the chest, abdomen and pelvis was performed following the standard  protocol during bolus administration of intravenous contrast. CONTRAST:  183mL ISOVUE-300 IOPAMIDOL (ISOVUE-300) INJECTION 61% COMPARISON:  CT 08/19/2009 FINDINGS: CT CHEST FINDINGS Cardiovascular: Heart size within normal limits. No pericardial fluid/thickening. Unremarkable course caliber and contour of the thoracic aorta. No significant atherosclerotic changes. Mediastinum/Nodes: Small lymph nodes of the mediastinum, none of which are enlarged. No available for comparison. Unremarkable appearance of the thoracic esophagus. Central airways are patent with no debris. Unremarkable appearance of the thoracic inlet. No supraclavicular or axillary adenopathy. Lungs/Pleura: No pneumothorax or pleural effusion. No confluent airspace disease. Musculoskeletal: No acute displaced fracture. No aggressive sclerotic or lytic lesions. No bony canal narrowing. CT ABDOMEN PELVIS FINDINGS Hepatobiliary: Coarse calcification of the right liver, which is otherwise unremarkable. Unremarkable appearance of the gallbladder. Pancreas: Unremarkable pancreas Spleen: Unremarkable spleen Adrenals/Urinary Tract: Unremarkable appearance of the adrenal glands. No evidence of hydronephrosis of the right or left kidney. No nephrolithiasis. Unremarkable course of the bilateral ureters. Unremarkable appearance of the urinary bladder. Stomach/Bowel: Unremarkable stomach. Proximal small bowel unremarkable. There is dilated distal jejunum and ileum with air-fluid levels. Enteric contrast does not reach the distal ileum. The dilated small bowel is in the region of the left abdomen with inflammatory changes of the fat and interloop fluid. Mild focal wall thickening in a short  segment of small bowel in the mid abdomen (image 94 series 3). Small bowel wall appears to enhance normally without focal thickening. Circumferential sigmoid colonic wall thickening on image 101 of series 3. Small volume of low-density free fluid adjacent to the sigmoid colon at  this site, within the left pericolic gutter, and extending into the dependent pelvis in the rectovesical space. No free air or rim enhancing focal fluid. There are multiple nodules/lymph nodes within the mesenteric fat of the sigmoid colon, and extending in the drainage pathway of the inferior mesenteric neurovascular bundle. Multiple small nodules/lymph nodes of small bowel mesentery, many of which were present on the comparison CT of 2011. Normal appendix. Vascular/Lymphatic: No significant atherosclerotic calcifications. Proximal femoral arteries are patent. Reproductive: Calcifications of the prostate. Transverse diameter prostate measures 3.6 cm. Other: Fat containing left inguinal hernia Musculoskeletal: No acute displaced fracture. No aggressive bony or lytic lesions. IMPRESSION: Acute inflammatory changes and free fluid associated with the sigmoid colon in the mesentery, centered at the site of the sigmoid colon mass, which is reported to have been recently biopsied during colonoscopy. The differential includes complicating features of colonoscopy/biopsy, infectious/inflammatory colitis, or potentially a perforating colon cancer which pre-existed the recent colonoscopy. CT demonstrates circumferential thickening of the sigmoid colon, compatible with colon cancer as is the given history. There are multiple small nodules/lymph nodes of the sigmoid mesentery which extend along the inferior mesenteric neurovascular drainage pathway. These may be pathologic with lymphatic extension versus reactive/inflammatory changes. Correlation with staging PET-CT may be useful. Borderline dilated small bowel with air-fluid levels, favored to represent reactive ileus given the degree of inflammation of the mesentery, and the absence of a discrete transition point. These results were called by telephone at the time of interpretation on 12/28/2017 at 1:57 pm to Dr. Carrie Mew. Electronically Signed   By: Corrie Mckusick D.O.    On: 12/28/2017 13:58    ____________________________________________   PROCEDURES Procedures  ____________________________________________  DIFFERENTIAL DIAGNOSIS   Bowel obstruction, bowel perforation, pancreatitis, gastritis, hernia.  Doubt cholecystitis, cholangitis, appendicitis.  Doubt mesenteric ischemia AAA or dissection.  CLINICAL IMPRESSION / ASSESSMENT AND PLAN / ED COURSE  Pertinent labs & imaging results that were available during my care of the patient were reviewed by me and considered in my medical decision making (see chart for details).    Patient presents with sudden onset of severe upper abdominal pain.  Abdominal exam is concerning.  Differential is broad, so I will obtain a CT scan of the abdomen and pelvis for further evaluation.  Checking labs and urinalysis.  IV Dilaudid 1 mg and IV Zofran 4 mg along with normal saline bolus for relief of pain and nausea.  Clinical Course as of Dec 28 1412  Mon Dec 28, 2017  1408 CT discussed with radiology who notes what appears to be microperforation and inflammation in the area of the sigmoid tumor.  Also some regional lymphadenopathy.  Discussed with surgery   [PS]    Clinical Course User Index [PS] Carrie Mew, MD     ----------------------------------------- 2:14 PM on 12/28/2017 -----------------------------------------  IV Zosyn ordered.  Surgery to see for further management.  Patient and family updated.  ____________________________________________   FINAL CLINICAL IMPRESSION(S) / ED DIAGNOSES    Final diagnoses:  Malignant neoplasm of sigmoid colon ([place])  Colitis, acute     ED Discharge Orders    None      Portions of this note were generated with dragon dictation software. Dictation errors may  occur despite best attempts at proofreading.   Carrie Mew, MD 12/28/17 1415

## 2017-12-28 NOTE — ED Notes (Signed)
Report to Ashley, RN

## 2017-12-28 NOTE — ED Notes (Signed)
Patient transported to CT 

## 2017-12-28 NOTE — Telephone Encounter (Signed)
Received call from [place] at [facility]. Mr. Murcia had colonoscopy 12/11 and a partially obstructing large mass was found at 32cm proximal to anus. The area was biopsied and tattooed. I do not have the pathology report but it is malignant per Christiane. We have received referral and he is being scheduled to see med onc following CT CAP. CT is scheduled for 12/18 and he sees Dr. Peyton Najjar 12/20. He is currently in the ED with abdominal pain and low grade fever. I will place him on tumor board for this Thursday. Will follow. Oncology Nurse Navigator Documentation  Navigator Location: [facility] (12/28/17 1100)   )Navigator Encounter Type: Telephone;Diagnostic Results (12/28/17 1100) Telephone: Incoming Call;Diagnostic Results (12/28/17 1100)                                                  Time Spent with Patient: 15 (12/28/17 1100)

## 2017-12-29 LAB — GLUCOSE, CAPILLARY
GLUCOSE-CAPILLARY: 300 mg/dL — AB (ref 70–99)
Glucose-Capillary: 227 mg/dL — ABNORMAL HIGH (ref 70–99)
Glucose-Capillary: 166 mg/dL — ABNORMAL HIGH (ref 70–99)
Glucose-Capillary: 158 mg/dL — ABNORMAL HIGH (ref 70–99)

## 2017-12-29 LAB — BASIC METABOLIC PANEL
ANION GAP: 9 (ref 5–15)
BUN: 20 mg/dL (ref 6–20)
CO2: 24 mmol/L (ref 22–32)
Calcium: 8.2 mg/dL — ABNORMAL LOW (ref 8.9–10.3)
Chloride: 99 mmol/L (ref 98–111)
Creatinine, Ser: 1.47 mg/dL — ABNORMAL HIGH (ref 0.61–1.24)
GFR calc Af Amer: >60 mL/min (ref >60)
GFR calc non Af Amer: 56 mL/min — ABNORMAL LOW (ref >60)
Glucose, Bld: 228 mg/dL — ABNORMAL HIGH (ref 70–99)
POTASSIUM: 4.4 mmol/L (ref 3.5–5.1)
Sodium: 132 mmol/L — ABNORMAL LOW (ref 135–145)

## 2017-12-29 LAB — CBC
HCT: 39.1 % (ref 39.0–52.0)
HEMOGLOBIN: 12.9 g/dL — AB (ref 13.0–17.0)
MCH: 28 pg (ref 26.0–34.0)
MCHC: 33 g/dL (ref 30.0–36.0)
MCV: 84.8 fL (ref 80.0–100.0)
Platelets: 271 10*3/uL (ref 150–400)
RBC: 4.61 MIL/uL (ref 4.22–5.81)
RDW: 11.9 % (ref 11.5–15.5)
WBC: 11.6 10*3/uL — ABNORMAL HIGH (ref 4.0–10.5)
nRBC: 0 % (ref 0.0–0.2)

## 2017-12-29 MED ORDER — SODIUM CHLORIDE 0.9 % IV BOLUS
1000.0000 mL | Freq: Once | INTRAVENOUS | Status: AC
  Administered 2017-12-29: 1000 mL via INTRAVENOUS

## 2017-12-29 MED ORDER — PANTOPRAZOLE SODIUM 40 MG PO TBEC
40.0000 mg | DELAYED_RELEASE_TABLET | Freq: Every day | ORAL | Status: DC
  Administered 2017-12-30 – 2018-01-04 (×5): 40 mg via ORAL
  Filled 2017-12-29 (×5): qty 1

## 2017-12-29 MED ORDER — OXYCODONE HCL 5 MG PO TABS: 5.0000 mg | ORAL_TABLET | ORAL | Status: DC | PRN

## 2017-12-29 NOTE — Progress Notes (Signed)
12/29/2017 0700 PM  Patient's temperature increased to 103.1, BP 92/41, HR 117. Per Dr. Hanley Seamen tylenol and contacted on call MD. Per MD give 1000 mL bolus x 1hr.   Fuller Mandril, RN

## 2017-12-29 NOTE — Progress Notes (Signed)
12/29/2017 04:30 PM  Alerted Dr. [name] of patients temperature 102.1.   Fuller Mandril, RN

## 2017-12-29 NOTE — Progress Notes (Addendum)
SURGICAL PROGRESS NOTE (cpt: [phone])  Patient seen and examined as described below with surgical PA-C, Ardell Isaacs.  Assessment/Plan: (ICD-10's: C18.9) In summary, patient is a 47 y.o. 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               - discussed colonoscopy results with Dr. Gustavo Lah             - IV antibiotics (Zosyn), clear liquids diet, pain control prn (minimize narcotics as able), IV fluids             - all risks, benefits, and alternatives to open partial colectomy with possible colostomy vs diverting ileostomy were discussed with the patient, all of his questions were answered to his expressed satisfaction, patient expresses he wishes to proceed, and informed consent was obtained.  - will plan for open sigmoid colectomy, possible colostomy vs diverting ileostomy, this Thursday 12/19             - medical management of comorbidities (including PPI), follow-up/trend am WBC  - possible bowel prep to the extent possible tomorrow             - monitor abdominal exam and bowel function             - DVT prophylaxis, ambulation encouraged  I have personally reviewed the patient's chart, evaluated/examined the patient, proposed the recommended management, and discussed these recommendations with the patient to his expressed satisfaction as well as with patient's RN.  -- [name]. [name], MD, [place]: [facility] - Partnering for exceptional care. Office: [facility] SURGICAL PROGRESS NOTE (cpt [phone])  Hospital Day(s): 1.   Post op day(s):  Marland Kitchen   Interval  History: Patient seen and examined. He did have to have fever to 102.1 last night which resolved with tylenol. He reports this morning that his pain has drastically improved. He now rates it a 1-2/10 which was a 10/10 yesterday. No complaints of nausea or emesis. Has been tolerating clear liquids. Passing flatus.   Review of Systems:  Constitutional: denies fever, chills  HEENT: denies cough or congestion  Respiratory: denies any shortness of breath  Cardiovascular: denies chest pain or palpitations  Gastrointestinal: + abdominal pain (improved), denied N/V, or diarrhea/and bowel function as per interval history Genitourinary: denies burning with urination or urinary frequency Musculoskeletal: denies pain, decreased motor or sensation Integumentary: denies any other rashes or skin discolorations Neurological: denies HA or vision/hearing changes   Vital signs in last 24 hours: [min-max] current  Temp:  [98 F (36.7 C)-102.1 F (38.9 C)] 100 F (37.8 C) (12/17 0459) Pulse Rate:  [73-123] 94 (12/17 0459) Resp:  [18-32] 18 (12/17 0459) BP: (104-130)/(48-80) 104/55 (12/17 0459) SpO2:  [89 %-100 %] 89 % (12/17 0459) Weight:  [90.2 kg-104.3 kg] 90.2 kg (12/17 0313)     Height: 5\' 7"  (170.2 cm) Weight: 90.2 kg BMI (Calculated): 31.13   Intake/Output this shift:  No intake/output data recorded.   Intake/Output last 2 shifts:  @IOLAST2SHIFTS @   Physical Exam:  Constitutional: alert, cooperative and no distress  HENT: normocephalic without obvious abnormality  Eyes: EOM's grossly intact and symmetric  Neuro: CN II - XII grossly intact  and symmetric without deficit  Respiratory: breathing non-labored at rest  Gastrointestinal: soft, non-tender, and non-distended Musculoskeletal: no edema or wounds, motor and sensation grossly intact, NT   Labs:  CBC Latest Ref Rng & Units 12/29/2017 12/28/2017  WBC 4.0 - 10.5 K/uL 11.6(H) 8.8  Hemoglobin 13.0 - 17.0 g/dL 12.9(L) 13.9  Hematocrit 39.0 -  52.0 % 39.1 42.0  Platelets 150 - 400 K/uL 271 347   CMP Latest Ref Rng & Units 12/29/2017 12/28/2017  Glucose 70 - 99 mg/dL 228(H) 213(H)  BUN 6 - 20 mg/dL 20 15  Creatinine 0.61 - 1.24 mg/dL 1.47(H) 1.25(H)  Sodium 135 - 145 mmol/L 132(L) 130(L)  Potassium 3.5 - 5.1 mmol/L 4.4 3.8  Chloride 98 - 111 mmol/L 99 98  CO2 22 - 32 mmol/L 24 22  Calcium 8.9 - 10.3 mg/dL 8.2(L) 8.5(L)  Total Protein 6.5 - 8.1 g/dL - 8.7(H)  Total Bilirubin 0.3 - 1.2 mg/dL - 0.9  Alkaline Phos 38 - 126 U/L - 75  AST 15 - 41 U/L - 19  ALT 0 - 44 U/L - 20   Imaging studies: No new pertinent imaging studies  Assessment/Plan: (ICD-10's: C18.9) 47 y.o. male with sigmoid colitis and suspected microperforation and inflammatory changes of his sigmoid colon most likely related to his recently diagnosed invasive adenocarcinoma of his sigmoid colon by colonoscopy on 12/11 (Dr Gustavo Lah) with concern for near colonic obstruction, complicated by pertinent comorbidities including Stage II CKD, HTN, and DM   - Continue clear liquids + IVF for now   - Continue IV ABx (Zosyn), Pain Control as needed (minimize narcotics if possible)  - Monitor abdominal examination and on-going bowel function   - Follow up AM labs (CBC/BMP)   - Dr [name] discussed colonoscopy findings with Dr Gustavo Lah and he felt the patient had a near obstruction secondary to tumor burden. Due to this, it is felt that the patient would unlikely resolve with conservative management and would benefit from sigmoid colectomy +/- colostomy vs diverting ileostomy. This was discussed in detail with the patient and he was agreeable.   - Plan for sigmoid colectomy on Thursday 12/19  - Mobilize  - DVT Prophlyaxis  All of the above findings and recommendations were discussed with the patient, patient's family, and the medical team, and all of patient's and his family's questions were answered to their expressed satisfaction.  -- [name], PA-C [facility] 12/29/2017, 8:52 AM [phone] M-F: 7am - 4pm

## 2017-12-30 ENCOUNTER — Ambulatory Visit: Admission: RE | Admit: 2017-12-30 | Payer: BLUE CROSS/BLUE SHIELD | Source: Ambulatory Visit

## 2017-12-30 ENCOUNTER — Encounter: Payer: Self-pay | Admitting: Anesthesiology

## 2017-12-30 LAB — URINALYSIS, ROUTINE W REFLEX MICROSCOPIC
Bilirubin Urine: NEGATIVE
Glucose, UA: 50 mg/dL — AB
Hgb urine dipstick: NEGATIVE
Ketones, ur: 5 mg/dL — AB
Leukocytes, UA: NEGATIVE
Nitrite: NEGATIVE
Protein, ur: NEGATIVE mg/dL
SPECIFIC GRAVITY, URINE: 1.014 (ref 1.005–1.030)
pH: 6 (ref 5.0–8.0)

## 2017-12-30 LAB — CBC
HEMATOCRIT: 33.5 % — AB (ref 39.0–52.0)
Hemoglobin: 11 g/dL — ABNORMAL LOW (ref 13.0–17.0)
MCH: 27.6 pg (ref 26.0–34.0)
MCHC: 32.8 g/dL (ref 30.0–36.0)
MCV: 84.2 fL (ref 80.0–100.0)
Platelets: 215 10*3/uL (ref 150–400)
RBC: 3.98 MIL/uL — ABNORMAL LOW (ref 4.22–5.81)
RDW: 11.7 % (ref 11.5–15.5)
WBC: 10.4 10*3/uL (ref 4.0–10.5)
nRBC: 0 % (ref 0.0–0.2)

## 2017-12-30 LAB — BASIC METABOLIC PANEL
Anion gap: 6 (ref 5–15)
BUN: 12 mg/dL (ref 6–20)
CO2: 22 mmol/L (ref 22–32)
Calcium: 7.8 mg/dL — ABNORMAL LOW (ref 8.9–10.3)
Chloride: 104 mmol/L (ref 98–111)
Creatinine, Ser: 1.07 mg/dL (ref 0.61–1.24)
GFR calc Af Amer: >60 mL/min (ref >60)
GFR calc non Af Amer: >60 mL/min (ref >60)
GLUCOSE: 188 mg/dL — AB (ref 70–99)
Potassium: 3.8 mmol/L (ref 3.5–5.1)
Sodium: 132 mmol/L — ABNORMAL LOW (ref 135–145)

## 2017-12-30 LAB — HIV ANTIBODY (ROUTINE TESTING W REFLEX): HIV Screen 4th Generation wRfx: NONREACTIVE

## 2017-12-30 LAB — GLUCOSE, CAPILLARY
Glucose-Capillary: 183 mg/dL — ABNORMAL HIGH (ref 70–99)
Glucose-Capillary: 198 mg/dL — ABNORMAL HIGH (ref 70–99)
Glucose-Capillary: 148 mg/dL — ABNORMAL HIGH (ref 70–99)
Glucose-Capillary: 274 mg/dL — ABNORMAL HIGH (ref 70–99)

## 2017-12-30 MED ORDER — OXYCODONE HCL 5 MG PO TABS
5.0000 mg | ORAL_TABLET | ORAL | Status: DC | PRN
  Administered 2018-01-01 – 2018-01-02 (×4): 5 mg via ORAL
  Filled 2017-12-30 (×4): qty 1

## 2017-12-30 MED ORDER — ACETAMINOPHEN 500 MG PO TABS
1000.0000 mg | ORAL_TABLET | ORAL | Status: AC
  Administered 2017-12-31: 1000 mg via ORAL
  Filled 2017-12-30: qty 2

## 2017-12-30 MED ORDER — CHLORHEXIDINE GLUCONATE CLOTH 2 % EX PADS
6.0000 | MEDICATED_PAD | Freq: Once | CUTANEOUS | Status: AC
  Administered 2017-12-31: 6 via TOPICAL

## 2017-12-30 MED ORDER — MORPHINE SULFATE (PF) 2 MG/ML IV SOLN
2.0000 mg | INTRAVENOUS | Status: DC | PRN
  Administered 2017-12-31 – 2018-01-01 (×3): 2 mg via INTRAVENOUS
  Filled 2017-12-30 (×3): qty 1

## 2017-12-30 MED ORDER — PEG 3350-KCL-NA BICARB-NACL 420 G PO SOLR
4000.0000 mL | Freq: Once | ORAL | Status: DC
  Filled 2017-12-30: qty 4000

## 2017-12-30 MED ORDER — GABAPENTIN 300 MG PO CAPS
300.0000 mg | ORAL_CAPSULE | ORAL | Status: AC
  Administered 2017-12-31: 300 mg via ORAL
  Filled 2017-12-30: qty 1

## 2017-12-30 MED ORDER — BUPIVACAINE LIPOSOME 1.3 % IJ SUSP
20.0000 mL | Freq: Once | INTRAMUSCULAR | Status: DC
  Filled 2017-12-30: qty 20

## 2017-12-30 MED ORDER — FLEET ENEMA 7-19 GM/118ML RE ENEM
1.0000 | ENEMA | Freq: Once | RECTAL | Status: DC
  Filled 2017-12-30: qty 1

## 2017-12-30 MED ORDER — POLYETHYLENE GLYCOL 3350 17 GM/SCOOP PO POWD
1.0000 | Freq: Once | ORAL | Status: AC
  Administered 2017-12-30: 255 g via ORAL
  Filled 2017-12-30: qty 255

## 2017-12-30 MED ORDER — ALVIMOPAN 12 MG PO CAPS
12.0000 mg | ORAL_CAPSULE | ORAL | Status: AC
  Administered 2017-12-31: 12 mg via ORAL
  Filled 2017-12-30: qty 1

## 2017-12-30 NOTE — Progress Notes (Signed)
Inpatient Diabetes Program Recommendations  AACE/ADA: New Consensus Statement on Inpatient Glycemic Control (2015)  Target Ranges:  Prepandial:   less than 140 mg/dL      Peak postprandial:   less than 180 mg/dL (1-2 hours)      Critically ill patients:  140 - 180 mg/dL   Lab Results  Component Value Date   GLUCAP 198 (H) 12/30/2017    Review of Glycemic Control Results for Adam Farrell, Adam Farrell (MRN [id number]) as of 12/30/2017 14:00  Ref. Range 12/29/2017 14:06 12/29/2017 16:28 12/29/2017 22:41 12/30/2017 07:53 12/30/2017 11:46  Glucose-Capillary Latest Ref Range: 70 - 99 mg/dL 300 (H) 166 (H) 158 (H) 183 (H) 198 (H)   Diabetes history: DM 2 Outpatient Diabetes medications:  None Current orders for Inpatient glycemic control:  Novolog moderate tid with meals and HS  Inpatient Diabetes Program Recommendations:   Please consider adding Lantus 15 units daily.  Also once patient is NPO, consider increasing Novolog correction to q 4 hours.    Thanks,  Adah Perl, RN, BC-ADM Inpatient Diabetes Coordinator Pager [phone] (8a-5p)

## 2017-12-30 NOTE — Progress Notes (Addendum)
SURGICAL PROGRESS NOTE (cpt: [phone])  Patient seen and examined as described below with surgical PA-C, Ardell Isaacs.  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   - discussed colonoscopy results with Dr. Gustavo Farrell - IV antibiotics (Zosyn), clear liquidsdiet, pain control prn (minimize narcotics), IV fluids - all risks, benefits, and alternatives to open partial colectomy with possible colostomy vs diverting ileostomy were discussed with the patient and his family, all of their questions were answered to their expressed satisfaction, patient expresses he wishes to proceed, and informed consent was obtained accordingly.             - NPO after midnight for open sigmoid colectomy, possible colostomy vs diverting ileostomy, tomorrow morning             - bowel prep as ordered and as tolerated (discussed with patient and his family)  - Dr. Darrol Poke 2nd opinion/discussion with patient appreciated - medical management of comorbidities (including PPI) - monitor abdominal exam and bowel function - DVT prophylaxis, ambulation encouraged  I have personally reviewed the patient's chart, evaluated/examined the patient, proposed the recommended management, and discussed these recommendations with the patient to his expressed satisfaction as well as with patient's RN.  -- [name]. [name], MD, [place]: [facility] - Partnering for exceptional care. Office:  [facility] SURGICAL PROGRESS NOTE (cpt [phone])  Hospital Day(s): 2.   Post op day(s):  Marland Kitchen   Interval History: Patient seen and examined, he again was febrile to 103.1 overnight which resolved with tylenol and IVF. Otherwise, the patient continues to note improvement in his LLQ abdominal pain. No complaints of nausea or emesis. He does now endorse frequent loose stool. He continues to notice pain and trouble initiating with urination, which is worse in the morning and improves somewhat throughout the day. He has been tolerating clear liquid diet without difficulty. Mobilizing well.   Of note, patient is requesting to at least consult and meet Dr Zachery Dauer this morning as this is the provider he was due to follow up with tomorrow as an outpatient prior to admission. He would like to meet him prior to proceeding with any operations.    Review of Systems:  Constitutional: + Fever, denied chills HEENT: denies cough or congestion  Respiratory: denies any shortness of breath  Cardiovascular: denies chest pain or palpitations  Gastrointestinal: denies abdominal pain, N/V, or diarrhea/and bowel function as per interval history Genitourinary: + Dysuria, + Hesitation Musculoskeletal: denies pain, decreased motor or sensation Integumentary: denies any other rashes or skin discolorations Neurological: denies HA or vision/hearing changes   Vital signs in last 24 hours: [min-max] current  Temp:  [98.8 F (37.1 C)-102.1 F (38.9 C)] 98.8 F (37.1 C) (12/18 0453) Pulse Rate:  [105-117] 111 (12/18 0453) Resp:  [18-25] 20 (12/18 0453) BP: (92-123)/(41-69) 123/65 (12/18 0453) SpO2:  [94 %-97 %] 95 % (12/18 0453)     Height: 5\' 7"  (170.2 cm) Weight: 90.2 kg BMI (Calculated): 31.13   Intake/Output this shift:  No intake/output data recorded.   Intake/Output last 2 shifts:  @IOLAST2SHIFTS @   Physical Exam:  Constitutional: alert, cooperative and no distress  HENT:  normocephalic without obvious  abnormality  Eyes: PERRL, EOM's grossly intact and symmetric  Respiratory: breathing non-labored at rest  Gastrointestinal: soft, non-tender, and non-distended Musculoskeletal: no edema or wounds, motor and sensation grossly intact, NT    Labs:  CBC Latest Ref Rng & Units 12/30/2017 12/29/2017 12/28/2017  WBC 4.0 - 10.5 K/uL 10.4 11.6(H) 8.8  Hemoglobin 13.0 - 17.0 g/dL 11.0(L) 12.9(L) 13.9  Hematocrit 39.0 - 52.0 % 33.5(L) 39.1 42.0  Platelets 150 - 400 K/uL 215 271 347   CMP Latest Ref Rng & Units 12/30/2017 12/29/2017 12/28/2017  Glucose 70 - 99 mg/dL 188(H) 228(H) 213(H)  BUN 6 - 20 mg/dL 12 20 15   Creatinine 0.61 - 1.24 mg/dL 1.07 1.47(H) 1.25(H)  Sodium 135 - 145 mmol/L 132(L) 132(L) 130(L)  Potassium 3.5 - 5.1 mmol/L 3.8 4.4 3.8  Chloride 98 - 111 mmol/L 104 99 98  CO2 22 - 32 mmol/L 22 24 22   Calcium 8.9 - 10.3 mg/dL 7.8(L) 8.2(L) 8.5(L)  Total Protein 6.5 - 8.1 g/dL - - 8.7(H)  Total Bilirubin 0.3 - 1.2 mg/dL - - 0.9  Alkaline Phos 38 - 126 U/L - - 75  AST 15 - 41 U/L - - 19  ALT 0 - 44 U/L - - 20     Imaging studies: No new pertinent imaging studies   Assessment/Plan: (ICD-10's: C18.9) 47 y.o. male with with sigmoid colitis and suspected microperforation andinflammatory changes of his sigmoid colon most likely related to his recently diagnosed invasive adenocarcinoma of his sigmoid colon by colonoscopy on 12/11 (Dr Adam Farrell) with concern for near colonic obstruction, complicated by pertinent comorbidities includingStage II CKD,HTN,and DM.   - Per patient request, will consult with Dr Windell Moment  - Continue clear liquids, NPO after midnight, IVF for now              - Continue IV ABx (Zosyn), Pain Control as needed (minimize narcotics if possible)             - Monitor abdominal examination and on-going bowel function              - Follow up AM labs (CBC/BMP)   - Tentatively continue plan for sigmoid colectomy +/- colostomy vs  diverting ileostomy with Dr [name] for 12/31/17   - Mobilize   - DVT Prophylaxis   All of the above findings and recommendations were discussed with the patient, and the medical team, and all of patient's questions were answered to his expressed satisfaction.   -- [name], PA-C [facility] 12/30/2017, 9:14 AM [phone] M-F: 7am - 4pm

## 2017-12-31 ENCOUNTER — Inpatient Hospital Stay: Payer: BLUE CROSS/BLUE SHIELD | Admitting: Anesthesiology

## 2017-12-31 ENCOUNTER — Encounter
Admission: EM | Disposition: A | Discharge status: Home / Self Care | Payer: Self-pay | Source: Home / Self Care | Attending: Surgery

## 2017-12-31 ENCOUNTER — Encounter: Payer: Self-pay | Admitting: Surgery

## 2017-12-31 DIAGNOSIS — K5669 Other partial intestinal obstruction: Secondary | ICD-10-CM

## 2017-12-31 DIAGNOSIS — C187 Malignant neoplasm of sigmoid colon: Secondary | ICD-10-CM

## 2017-12-31 HISTORY — PX: COLON RESECTION SIGMOID: SHX6737

## 2017-12-31 HISTORY — PX: COLOSTOMY: SHX63

## 2017-12-31 LAB — BASIC METABOLIC PANEL
Anion gap: 7 (ref 5–15)
BUN: 11 mg/dL (ref 6–20)
CHLORIDE: 103 mmol/L (ref 98–111)
CO2: 23 mmol/L (ref 22–32)
Calcium: 7.8 mg/dL — ABNORMAL LOW (ref 8.9–10.3)
Creatinine, Ser: 1.08 mg/dL (ref 0.61–1.24)
GFR calc Af Amer: >60 mL/min (ref >60)
GFR calc non Af Amer: >60 mL/min (ref >60)
Glucose, Bld: 191 mg/dL — ABNORMAL HIGH (ref 70–99)
Potassium: 3.5 mmol/L (ref 3.5–5.1)
Sodium: 133 mmol/L — ABNORMAL LOW (ref 135–145)

## 2017-12-31 LAB — CBC
HEMATOCRIT: 32.6 % — AB (ref 39.0–52.0)
HEMOGLOBIN: 10.6 g/dL — AB (ref 13.0–17.0)
MCH: 27.7 pg (ref 26.0–34.0)
MCHC: 32.5 g/dL (ref 30.0–36.0)
MCV: 85.1 fL (ref 80.0–100.0)
Platelets: 214 10*3/uL (ref 150–400)
RBC: 3.83 MIL/uL — ABNORMAL LOW (ref 4.22–5.81)
RDW: 11.9 % (ref 11.5–15.5)
WBC: 8.9 10*3/uL (ref 4.0–10.5)
nRBC: 0 % (ref 0.0–0.2)

## 2017-12-31 LAB — GLUCOSE, CAPILLARY
GLUCOSE-CAPILLARY: 238 mg/dL — AB (ref 70–99)
GLUCOSE-CAPILLARY: 274 mg/dL — AB (ref 70–99)
Glucose-Capillary: 227 mg/dL — ABNORMAL HIGH (ref 70–99)
Glucose-Capillary: 256 mg/dL — ABNORMAL HIGH (ref 70–99)
Glucose-Capillary: 245 mg/dL — ABNORMAL HIGH (ref 70–99)

## 2017-12-31 SURGERY — COLECTOMY, SIGMOID, OPEN
Anesthesia: General

## 2017-12-31 MED ORDER — DEXTROSE IN LACTATED RINGERS 5 % IV SOLN
INTRAVENOUS | Status: DC
  Administered 2017-12-31: 13:00:00 via INTRAVENOUS

## 2017-12-31 MED ORDER — ACETAMINOPHEN 500 MG PO TABS
1000.0000 mg | ORAL_TABLET | Freq: Four times a day (QID) | ORAL | Status: DC
  Administered 2017-12-31 – 2018-01-04 (×12): 1000 mg via ORAL
  Filled 2017-12-31 (×15): qty 2

## 2017-12-31 MED ORDER — PROPOFOL 10 MG/ML IV BOLUS
INTRAVENOUS | Status: DC | PRN
  Administered 2017-12-31: 120 mg via INTRAVENOUS

## 2017-12-31 MED ORDER — FENTANYL CITRATE (PF) 100 MCG/2ML IJ SOLN
INTRAMUSCULAR | Status: DC | PRN
  Administered 2017-12-31 (×5): 50 ug via INTRAVENOUS

## 2017-12-31 MED ORDER — KETOROLAC TROMETHAMINE 30 MG/ML IJ SOLN
30.0000 mg | Freq: Four times a day (QID) | INTRAMUSCULAR | Status: DC
  Administered 2017-12-31 – 2018-01-04 (×15): 30 mg via INTRAVENOUS
  Filled 2017-12-31 (×15): qty 1

## 2017-12-31 MED ORDER — BUPIVACAINE LIPOSOME 1.3 % IJ SUSP
INTRAMUSCULAR | Status: DC | PRN
  Administered 2017-12-31: 50 mL

## 2017-12-31 MED ORDER — FENTANYL CITRATE (PF) 100 MCG/2ML IJ SOLN
INTRAMUSCULAR | Status: AC
  Filled 2017-12-31: qty 2

## 2017-12-31 MED ORDER — ESMOLOL HCL 100 MG/10ML IV SOLN
INTRAVENOUS | Status: AC
  Filled 2017-12-31: qty 10

## 2017-12-31 MED ORDER — FENTANYL CITRATE (PF) 100 MCG/2ML IJ SOLN
25.0000 ug | INTRAMUSCULAR | Status: DC | PRN
  Administered 2017-12-31 (×2): 25 ug via INTRAVENOUS

## 2017-12-31 MED ORDER — INSULIN ASPART 100 UNIT/ML ~~LOC~~ SOLN
0.0000 [IU] | SUBCUTANEOUS | Status: DC
  Administered 2018-01-01: 3 [IU] via SUBCUTANEOUS
  Administered 2018-01-01: 5 [IU] via SUBCUTANEOUS
  Administered 2018-01-01 (×4): 3 [IU] via SUBCUTANEOUS
  Administered 2018-01-02 (×2): 2 [IU] via SUBCUTANEOUS
  Administered 2018-01-02 (×2): 1 [IU] via SUBCUTANEOUS
  Administered 2018-01-02: 3 [IU] via SUBCUTANEOUS
  Administered 2018-01-02 – 2018-01-03 (×2): 2 [IU] via SUBCUTANEOUS
  Administered 2018-01-03 (×2): 1 [IU] via SUBCUTANEOUS
  Administered 2018-01-04: 2 [IU] via SUBCUTANEOUS
  Administered 2018-01-04 (×3): 1 [IU] via SUBCUTANEOUS
  Filled 2017-12-31 (×19): qty 1

## 2017-12-31 MED ORDER — HYDROMORPHONE HCL 1 MG/ML IJ SOLN
INTRAMUSCULAR | Status: AC
  Filled 2017-12-31: qty 1

## 2017-12-31 MED ORDER — MIDAZOLAM HCL 2 MG/2ML IJ SOLN
INTRAMUSCULAR | Status: DC | PRN
  Administered 2017-12-31: 2 mg via INTRAVENOUS

## 2017-12-31 MED ORDER — SEVOFLURANE IN SOLN
RESPIRATORY_TRACT | Status: AC
  Filled 2017-12-31: qty 250

## 2017-12-31 MED ORDER — ONDANSETRON HCL 4 MG/2ML IJ SOLN
INTRAMUSCULAR | Status: DC | PRN
  Administered 2017-12-31: 4 mg via INTRAVENOUS

## 2017-12-31 MED ORDER — PROPOFOL 10 MG/ML IV BOLUS
INTRAVENOUS | Status: AC
  Filled 2017-12-31: qty 40

## 2017-12-31 MED ORDER — LIDOCAINE HCL (CARDIAC) PF 100 MG/5ML IV SOSY
PREFILLED_SYRINGE | INTRAVENOUS | Status: DC | PRN
  Administered 2017-12-31: 100 mg via INTRAVENOUS

## 2017-12-31 MED ORDER — DEXAMETHASONE SODIUM PHOSPHATE 10 MG/ML IJ SOLN
INTRAMUSCULAR | Status: DC | PRN
  Administered 2017-12-31: 5 mg via INTRAVENOUS

## 2017-12-31 MED ORDER — FENTANYL CITRATE (PF) 250 MCG/5ML IJ SOLN
INTRAMUSCULAR | Status: AC
  Filled 2017-12-31: qty 5

## 2017-12-31 MED ORDER — ENOXAPARIN SODIUM 40 MG/0.4ML ~~LOC~~ SOLN
40.0000 mg | SUBCUTANEOUS | Status: DC
  Administered 2018-01-01 – 2018-01-04 (×4): 40 mg via SUBCUTANEOUS
  Filled 2017-12-31 (×4): qty 0.4

## 2017-12-31 MED ORDER — ESMOLOL HCL 100 MG/10ML IV SOLN
INTRAVENOUS | Status: DC | PRN
  Administered 2017-12-31: 30 mg via INTRAVENOUS
  Administered 2017-12-31: 10 mg via INTRAVENOUS

## 2017-12-31 MED ORDER — LACTATED RINGERS IV SOLN
INTRAVENOUS | Status: DC
  Administered 2017-12-31 – 2018-01-03 (×10): via INTRAVENOUS

## 2017-12-31 MED ORDER — ROCURONIUM BROMIDE 50 MG/5ML IV SOLN
INTRAVENOUS | Status: AC
  Filled 2017-12-31: qty 2

## 2017-12-31 MED ORDER — SUGAMMADEX SODIUM 500 MG/5ML IV SOLN
INTRAVENOUS | Status: DC | PRN
  Administered 2017-12-31: 360.8 mg via INTRAVENOUS

## 2017-12-31 MED ORDER — MIDAZOLAM HCL 2 MG/2ML IJ SOLN
INTRAMUSCULAR | Status: AC
  Filled 2017-12-31: qty 2

## 2017-12-31 MED ORDER — ONDANSETRON HCL 4 MG/2ML IJ SOLN: 4.0000 mg | Freq: Once | INTRAMUSCULAR | Status: DC | PRN

## 2017-12-31 MED ORDER — HYDROMORPHONE HCL 1 MG/ML IJ SOLN
INTRAMUSCULAR | Status: DC | PRN
  Administered 2017-12-31 (×2): 0.5 mg via INTRAVENOUS

## 2017-12-31 MED ORDER — LACTATED RINGERS IV SOLN
INTRAVENOUS | Status: DC | PRN
  Administered 2017-12-31 (×2): via INTRAVENOUS

## 2017-12-31 MED ORDER — ROCURONIUM BROMIDE 100 MG/10ML IV SOLN
INTRAVENOUS | Status: DC | PRN
  Administered 2017-12-31 (×2): 10 mg via INTRAVENOUS
  Administered 2017-12-31: 30 mg via INTRAVENOUS
  Administered 2017-12-31 (×2): 20 mg via INTRAVENOUS

## 2017-12-31 SURGICAL SUPPLY — 54 items
APPLIER CLIP 11 MED OPEN (CLIP)
APPLIER CLIP 13 LRG OPEN (CLIP)
BARRIER ADH SEPRAFILM 3INX5IN (MISCELLANEOUS) ×6 IMPLANT
BULB RESERV EVAC DRAIN JP 100C (MISCELLANEOUS) ×3 IMPLANT
CHLORAPREP W/TINT 26ML (MISCELLANEOUS) ×3 IMPLANT
CLIP APPLIE 11 MED OPEN (CLIP) IMPLANT
CLIP APPLIE 13 LRG OPEN (CLIP) IMPLANT
COVER WAND RF STERILE (DRAPES) ×3 IMPLANT
DRAIN CHANNEL JP 15F RND 16 (MISCELLANEOUS) ×3 IMPLANT
DRAPE LAPAROTOMY T 102X78X121 (DRAPES) ×3 IMPLANT
DRAPE LEGGINS SURG 28X43 STRL (DRAPES) ×3 IMPLANT
DRAPE UNDER BUTTOCK W/FLU (DRAPES) ×3 IMPLANT
DRSG OPSITE POSTOP 4X10 (GAUZE/BANDAGES/DRESSINGS) IMPLANT
DRSG OPSITE POSTOP 4X14 (GAUZE/BANDAGES/DRESSINGS) ×3 IMPLANT
DRSG OPSITE POSTOP 4X8 (GAUZE/BANDAGES/DRESSINGS) IMPLANT
ELECT BLADE 6 FLAT ULTRCLN (ELECTRODE) IMPLANT
ELECT REM PT RETURN 9FT ADLT (ELECTROSURGICAL) ×3
ELECTRODE REM PT RTRN 9FT ADLT (ELECTROSURGICAL) ×1 IMPLANT
EXTRT SYSTEM ALEXIS 17CM (MISCELLANEOUS) ×3
GLOVE BIO SURGEON STRL SZ7 (GLOVE) ×18 IMPLANT
GLOVE BIOGEL PI IND STRL 7.5 (GLOVE) ×6 IMPLANT
GLOVE BIOGEL PI INDICATOR 7.5 (GLOVE) ×12
GOWN STRL REUS W/TWL LRG LVL3 (GOWN DISPOSABLE) ×18 IMPLANT
HANDLE SUCTION POOLE (INSTRUMENTS) ×1 IMPLANT
HANDLE YANKAUER SUCT BULB TIP (MISCELLANEOUS) ×3 IMPLANT
LIGASURE IMPACT 36 18CM CVD LR (INSTRUMENTS) IMPLANT
NEEDLE HYPO 18GX1.5 BLUNT FILL (NEEDLE) ×3 IMPLANT
NEEDLE HYPO 21X1.5 SAFETY (NEEDLE) ×3 IMPLANT
NS IRRIG 1000ML POUR BTL (IV SOLUTION) ×6 IMPLANT
PACK BASIN MAJOR ARMC (MISCELLANEOUS) ×3 IMPLANT
PACK COLON CLEAN CLOSURE (MISCELLANEOUS) ×3 IMPLANT
RELOAD PROXIMATE 75MM BLUE (ENDOMECHANICALS) ×3 IMPLANT
RETRACTOR WND ALEXIS-O 25 LRG (MISCELLANEOUS) ×1 IMPLANT
RTRCTR WOUND ALEXIS O 25CM LRG (MISCELLANEOUS) ×3
SPONGE KITTNER 5P (MISCELLANEOUS) ×3 IMPLANT
SPONGE LAP 18X18 RF (DISPOSABLE) ×9 IMPLANT
STAPLER PROXIMATE 75MM BLUE (STAPLE) ×3 IMPLANT
SUCTION POOLE HANDLE (INSTRUMENTS) ×3
SUT ETHILON 3-0 FS-10 30 BLK (SUTURE) ×3
SUT PDS AB 1 TP1 96 (SUTURE) ×3 IMPLANT
SUT PROLENE 2 0 FS (SUTURE) ×3 IMPLANT
SUT PROLENE 2 0 SH DA (SUTURE) ×3 IMPLANT
SUT SILK 2 0 (SUTURE)
SUT SILK 2 0 SH (SUTURE) ×6 IMPLANT
SUT SILK 2 0SH CR/8 30 (SUTURE) ×3 IMPLANT
SUT SILK 2-0 18XBRD TIE 12 (SUTURE) IMPLANT
SUT SILK 3-0 (SUTURE) ×3 IMPLANT
SUT VIC AB 3-0 SH 27 (SUTURE) ×4
SUT VIC AB 3-0 SH 27X BRD (SUTURE) ×2 IMPLANT
SUTURE EHLN 3-0 FS-10 30 BLK (SUTURE) ×1 IMPLANT
SYR 20CC LL (SYRINGE) ×3 IMPLANT
SYSTEM CONTND EXTRCTN KII BLLN (MISCELLANEOUS) ×1 IMPLANT
TOWEL OR 17X26 4PK STRL BLUE (TOWEL DISPOSABLE) ×3 IMPLANT
TRAY FOLEY MTR SLVR 16FR STAT (SET/KITS/TRAYS/PACK) ×3 IMPLANT

## 2017-12-31 NOTE — Progress Notes (Signed)
Transferred to OR.

## 2017-12-31 NOTE — Anesthesia Preprocedure Evaluation (Signed)
Anesthesia Evaluation  Patient identified by MRN, date of birth, ID band Patient awake    Reviewed: Allergy & Precautions, NPO status , Patient's Chart, lab work & pertinent test results, reviewed documented beta blocker date and time   Airway Mallampati: III  TM Distance: >3 FB     Dental  (+) Chipped   Pulmonary           Cardiovascular hypertension, Pt. on medications      Neuro/Psych    GI/Hepatic   Endo/Other  diabetes, Type 2  Renal/GU Renal disease     Musculoskeletal   Abdominal   Peds  Hematology   Anesthesia Other Findings HR 117. Hb 11. EKG ok.  Reproductive/Obstetrics                             Anesthesia Physical Anesthesia Plan  ASA: III  Anesthesia Plan: General   Post-op Pain Management:    Induction: Intravenous  PONV Risk Score and Plan:   Airway Management Planned: Oral ETT  Additional Equipment:   Intra-op Plan:   Post-operative Plan:   Informed Consent: I have reviewed the patients History and Physical, chart, labs and discussed the procedure including the risks, benefits and alternatives for the proposed anesthesia with the patient or authorized representative who has indicated his/her understanding and acceptance.     Plan Discussed with: CRNA  Anesthesia Plan Comments:         Anesthesia Quick Evaluation

## 2017-12-31 NOTE — Op Note (Signed)
SURGICAL OPERATIVE REPORT  DATE OF PROCEDURE: 12/31/2017  ATTENDING: Surgeon(s): Vickie Epley, MD  ASSISTANT(S): Pabon, Marjory Lies, MD  ANESTHESIA: GETA  PRE-OPERATIVE DIAGNOSIS: Partially obstructing malignancy of the sigmoid colon (icd-10's: C18.9, K56.690)  POST-OPERATIVE DIAGNOSIS: Partially obstructing malignancy of the sigmoid colon with multiple pelvic and peritoneal abscesses, associated distal sigmoid colitis and rectal proctitis (icd-10's: C18.9, K56.690)  PROCEDURE(S): (cpt's: 07371, 06269) 1.) Sigmoid colectomy 2.) Greater omentectomy 3.) Takedown of the splenic flexure 4.) Drainage of pelvic and peritoneal abscesses 5.) Creation of end colostomy  INTRAOPERATIVE FINDINGS: Large bulky tattooed sigmoid colonic tumor adherent to the lateral anterior peritoneum, inflamed rubbery edematous distal sigmoid colon - rectum with pelvic and RLQ peritoneal abscesses   INTRAVENOUS FLUIDS: 1700 mL crystalloid   ESTIMATED BLOOD LOSS: 150 mL   URINE OUTPUT: 200 mL   SPECIMENS: Sigmoid colon and greater omentum  IMPLANTS: None  DRAINS: 51F round drain to pelvis and LLQ via RLQ insertion site  COMPLICATIONS: None apparent   CONDITION AT END OF PROCEDURE: Hemodynamically stable and extubated   DISPOSITION OF PATIENT: PACU  INDICATIONS FOR PROCEDURE:  Patient is a 47 y.o. male who presented to [facility] for acute onset of severe LUQ and lower abdominal pain 5 days after he underwent colonoscopy for bright red blood per rectum and was found to have a nearly obstructing invasive adenocarcinoma of his sigmoid colon. Patient was found on CT to have microperforation of his sigmoid colon adjacent to sigmoid colitis and was initially placed on antibiotics accordingly while pre-operative workup was completed. All risks, benefits, and alternatives to above procedures were discussed at length with the patient and his family, all of patient's and family's questions were answered to their  expressed satisfaction, and informed consent was obtained and documented.  DETAILS OF PROCEDURE: Patient was brought to the operating suite and appropriately identified. General anesthesia was administered along with appropriate pre-operative antibiotics, and endotracheal intubation was performed by anesthetist. In supine position, operative site was prepped and draped in the usual sterile fashion, and following a brief time out, a vertical midline incision was made from the Left side of the umbilicus inferiorly to just above the pubis using a #10 blade scalpel and extended deep through subcutaneous tissues until fascia was visualized and exposed along the linea alba midline between the rectus abdominal muscles. Lower midline fascia was then divided in the midline superiorly. Upon dissecting through preperitoneal fat, the peritoneum was entered and opened along the length of the incision, taking care to avoid injury to underlying bowel.  Large wound protector was placed, and patients large, firm, and irregular mass was palpated in patient's sigmoid colon. Abdominal cavity was explored, and the liver was palpated with neither visible nor palpable evidence of hepatic, omental, or peritoneal metastases. The small bowel was retracted to the superiorly and to the Right. The descending colon was then retracted medially using a moist towel. The distal sigmoid colon and rectum were found to be severely inflamed, rubbery, and edematous as they were bathed in a deep pelvic abscess. Using a combination of sharp dissection and electrocautery, the colon was freed from its lateral peritoneal attachments along white line of Toldt and particularly dense fibrotic adhesions to the anterolateral peritoneal wall adjacent to patient's large irregular sigmoid mass, distally approaching the rectosigmoid junction and proximally from the splenic flexure. During this dissection, injury to the ureters was avoided, the Left ureter of  which was identified and protected. The splenic flexure was taken down in order to provide  enough length for creation of descending end colostomy, considering the condition of patient's distal sigmoid colon and rectum. Points of transection were selected distally and proximally, and the proximal bowel was stapled and divided using a GIA-75 linear cutting stapler, while the distal colon was stapled and divided likewise using an GIA-75 linear cutting stapler reload. The proximal rectal stump was inspected and marked using a 2-0 Prolene suture with tied tails left long to facilitate future identification during anticipated colostomy reversal. Peritoneum was then scored along the mesentary with electrocautery, and the vessels were cauterized, sealed, and divided using the Ligasure. Colon specimen was then removed and handed off the field as specimen for pathology. Round Left upper quadrant skin incision was created for creation of end colostomy at an adequate distance away from patient's midline laparotomy incision and extended deep through anterior and posterior fascia, between which muscle fibers were spread apart, but not divided.  Limited peri-colic fat was trimmed from the distal end of remaining descending colon to facilitate creation of end colostomy, though taking care to minimize risk for ischemia. Distal end of remaining descending colon was then brought out through LUQ and confirmed to reach appropriate length without tension. Peritonea cavity was then irrigated and suctioned until clear, and 74F round pelvic/RLQ drain was placed via RLQ and secured with 3-0 Nylon suture. Exparel (72-hour release liposomal formulation of bupivicane) was injected into fascia and subcutaneously, and running #1 looped PDS was used to re-approximate fascia from above and below and was tied below the umbilicus. 3-0 Vicryl buried interrupted sutures were then used to re-approximate dermis, and surgical skin staples were used to  re-approximate skin. Skin was then cleaned and dried, and a sterile honeycomb-type adhesive dressing was applied. Patient was then safely able to be extubated, awakened, and transferred to PACU for post-operative monitoring and care.  I was present for all aspects of the above procedure, and no operative complications were apparent.

## 2017-12-31 NOTE — Progress Notes (Signed)
Inpatient Diabetes Program Recommendations  AACE/ADA: New Consensus Statement on Inpatient Glycemic Control (2015)  Target Ranges:  Prepandial:   less than 140 mg/dL      Peak postprandial:   less than 180 mg/dL (1-2 hours)      Critically ill patients:  140 - 180 mg/dL   Lab Results  Component Value Date   GLUCAP 256 (H) 12/31/2017   Diabetes history: DM 2 Outpatient Diabetes medications:  None Current orders for Inpatient glycemic control:  Novolog moderate tid with meals and HS Inpatient Diabetes Program Recommendations:    Please consider changing Novolog correction to sensitive q 4 hours.  Patient is NPO after surgery today.  Sent secure message to MD.   Thanks,  Adah Perl, RN, BC-ADM Inpatient Diabetes Coordinator Pager [phone] (8a-5p)

## 2017-12-31 NOTE — Progress Notes (Signed)
IS left at bedside, pt requested to not do at this time, will reassess at a later time.

## 2017-12-31 NOTE — Transfer of Care (Signed)
Immediate Anesthesia Transfer of Care Note  Patient: Adam Farrell  Procedure(s) Performed: COLON RESECTION SIGMOID (N/A ) COLOSTOMY (N/A )  Patient Location: PACU  Anesthesia Type:General  Level of Consciousness: awake  Airway & Oxygen Therapy: Patient Spontanous Breathing  Post-op Assessment: Report given to RN  Post vital signs: Reviewed  Last Vitals:  Vitals Value Taken Time  BP 122/76 12/31/2017 12:23 PM  Temp    Pulse 100 12/31/2017 12:31 PM  Resp 17 12/31/2017 12:31 PM  SpO2 95 % 12/31/2017 12:31 PM  Vitals shown include unvalidated device data.  Last Pain:  Vitals:   12/31/17 1208  TempSrc:   PainSc: Asleep      Patients Stated Pain Goal: 2 (04/02/20 4825)  Complications: No apparent anesthesia complications

## 2017-12-31 NOTE — Progress Notes (Signed)
Gave report prior to transfer to OR - instructed to give meds by 0630

## 2017-12-31 NOTE — Anesthesia Procedure Notes (Signed)
Procedure Name: Intubation Date/Time: 12/31/2017 8:07 AM Performed by: Timoteo Expose, CRNA Pre-anesthesia Checklist: Patient identified, Emergency Drugs available, Suction available, Timeout performed and Patient being monitored Patient Re-evaluated:Patient Re-evaluated prior to induction Oxygen Delivery Method: Circle system utilized Preoxygenation: Pre-oxygenation with 100% oxygen Induction Type: IV induction Ventilation: Mask ventilation without difficulty and Oral airway inserted - appropriate to patient size Laryngoscope Size: Mac and 4 Grade View: Grade III Tube type: Oral Tube size: 7.5 mm Number of attempts: 1 Airway Equipment and Method: Stylet Placement Confirmation: ETT inserted through vocal cords under direct vision,  positive ETCO2,  breath sounds checked- equal and bilateral and CO2 detector Secured at: 21 cm Tube secured with: Tape Dental Injury: Teeth and Oropharynx as per pre-operative assessment

## 2017-12-31 NOTE — Anesthesia Post-op Follow-up Note (Signed)
Anesthesia QCDR form completed.        

## 2017-12-31 NOTE — Anesthesia Postprocedure Evaluation (Signed)
Anesthesia Post Note  Patient: Adam Farrell  Procedure(s) Performed: COLON RESECTION SIGMOID (N/A ) COLOSTOMY (N/A )  Patient location during evaluation: PACU Anesthesia Type: General Level of consciousness: awake and alert Pain management: pain level controlled Vital Signs Assessment: post-procedure vital signs reviewed and stable Respiratory status: spontaneous breathing, nonlabored ventilation, respiratory function stable and patient connected to nasal cannula oxygen Cardiovascular status: blood pressure returned to baseline and stable Postop Assessment: no apparent nausea or vomiting Anesthetic complications: no     Last Vitals:  Vitals:   12/31/17 1321 12/31/17 1407  BP: 134/77 123/83  Pulse: 99 92  Resp: 18 16  Temp:  36.6 C  SpO2: 93% 96%    Last Pain:  Vitals:   12/31/17 1253  TempSrc:   PainSc: Mary Esther S

## 2017-12-31 NOTE — Consult Note (Addendum)
[place] Nurse ostomy consult note Pt just arrived to the floor after colostomy surgery today; he is groggy and did not wake up for the conversation.  Family member at the bedside requests a teaching session tomorrow at 8:30 for pouch change demonstration.  Extra pouching supplies and educational materials left at the bedside. Stoma type/location:  Colostomy surgery was performed to LLQ Stomal assessment/size: Stoma is red and viable, appears to be slightly above skin level Ostomy pouching: Current pouch is intact with good seal, no stool or flatus Enrolled patient in [place] program: No  Julien Girt MSN, [place], [place], [place], [place]

## 2018-01-01 LAB — CBC
HCT: 32.7 % — ABNORMAL LOW (ref 39.0–52.0)
Hemoglobin: 10.3 g/dL — ABNORMAL LOW (ref 13.0–17.0)
MCH: 27.3 pg (ref 26.0–34.0)
MCHC: 31.5 g/dL (ref 30.0–36.0)
MCV: 86.7 fL (ref 80.0–100.0)
Platelets: 278 10*3/uL (ref 150–400)
RBC: 3.77 MIL/uL — ABNORMAL LOW (ref 4.22–5.81)
RDW: 12.1 % (ref 11.5–15.5)
WBC: 8 10*3/uL (ref 4.0–10.5)
nRBC: 0 % (ref 0.0–0.2)

## 2018-01-01 LAB — BASIC METABOLIC PANEL
Anion gap: 5 (ref 5–15)
BUN: 20 mg/dL (ref 6–20)
CO2: 24 mmol/L (ref 22–32)
Calcium: 7.6 mg/dL — ABNORMAL LOW (ref 8.9–10.3)
Chloride: 107 mmol/L (ref 98–111)
Creatinine, Ser: 1.02 mg/dL (ref 0.61–1.24)
GFR calc Af Amer: >60 mL/min (ref >60)
GFR calc non Af Amer: >60 mL/min (ref >60)
GLUCOSE: 248 mg/dL — AB (ref 70–99)
Potassium: 4.1 mmol/L (ref 3.5–5.1)
Sodium: 136 mmol/L (ref 135–145)

## 2018-01-01 LAB — GLUCOSE, CAPILLARY
Glucose-Capillary: 206 mg/dL — ABNORMAL HIGH (ref 70–99)
Glucose-Capillary: 207 mg/dL — ABNORMAL HIGH (ref 70–99)
Glucose-Capillary: 217 mg/dL — ABNORMAL HIGH (ref 70–99)
Glucose-Capillary: 225 mg/dL — ABNORMAL HIGH (ref 70–99)
Glucose-Capillary: 233 mg/dL — ABNORMAL HIGH (ref 70–99)
Glucose-Capillary: 240 mg/dL — ABNORMAL HIGH (ref 70–99)

## 2018-01-01 MED ORDER — BOOST / RESOURCE BREEZE PO LIQD CUSTOM
1.0000 | Freq: Three times a day (TID) | ORAL | Status: DC
  Administered 2018-01-01 – 2018-01-03 (×5): 1 via ORAL

## 2018-01-01 NOTE — Progress Notes (Addendum)
SURGICAL PROGRESS NOTE  Patient seen and examined as described below with surgical PA-C, Ardell Isaacs.  Assessment/Plan: (ICD-10's: C18.9, K12.690) 47 y.o. male doing well with anticipated post-surgical ileus 1 Day Post-Op s/p open sigmoid colectomy with takedown of the splenic flexure and creation of end-colostomy for nearly obstructinginvasive adenocarcinoma of the sigmoid colon and multiple pelvic and peritoneal abscesses along with sigmoid colitis and likely with microperforation following recent colonoscopy Gustavo Lah, 12/11) for bright red blood per rectum,complicated by comorbidities includingDM, HTN,GERD with reflux esophagitis,and CKD (stage 2).   - remove Foley catheter  - pain control as needed (minimize narcotics as able)  - okay to resume clear liquids diet with Boost Breeze clear liquid nutritional supplements  - follow up pending pathology and advance diet as resumes bowel function  - monitor ongoing abdominal exam, bowel function, and JP drainage  - medical management of comorbidities (home medications)  - DVT prophylaxis, ambulation encouraged  All of the above findings and recommendations were discussed with the patient, patient's family, and patient's RN, and all of patient's and family's questions were answered to their expressed satisfaction.  -- [name] [name], MD, [place]: [facility] - Partnering for exceptional care. Office: [place] Hospital Day(s): 4.   Post op day(s): 1 Day Post-Op.   Interval History: Patient seen and examined, no acute events or new complaints overnight. Patient reports that he has abdominal soreness near his incision but he otherwise feels improved from pre-op. He denied any fever, chills, nausea, or emesis. Has been NPO since the procedure. No other additional complaints so far. No ostomy output. JP with 180 ccs out since placement.    Review of Systems:  Constitutional: denies fever, chills  Respiratory: denies any shortness of breath  Cardiovascular: denies chest pain or palpitations  Gastrointestinal: +abdominal soreness, denied N/V, and bowel function as per interval history Musculoskeletal: denies pain, decreased motor or sensation Integumentary: denies any other rashes or skin discolorations except midline laparotomy and ostomy  Vital signs in last 24 hours: [min-max] current  Temp:  [97.7 F (36.5 C)-98.5 F (36.9 C)] 98 F (36.7 C) (12/20 0405) Pulse Rate:  [80-106] 80 (12/20 0405) Resp:  [12-20] 20 (12/20 0405) BP: (111-134)/(67-83) 118/79 (12/20 0405) SpO2:  [93 %-99 %] 97 % (12/20 0405)     Height: 5\' 7"  (170.2 cm) Weight: 90.2 kg BMI (Calculated): 31.13   Intake/Output this shift:  No intake/output data recorded.   Intake/Output last 2 shifts:  @IOLAST2SHIFTS @   Physical Exam:  Constitutional: alert, cooperative and no distress  Respiratory: breathing non-labored at rest  Gastrointestinal: soft, incisional tenderness, and non-distended. Colostomy in the LLQ which is pink and patent, no gas or stool in bag. JP in RLQ with serosanguinous fluid in bulb Integumentary: Midline laparotomy incision is CDI without erythema or drainage, honeycomb in place  Labs:  CBC Latest Ref Rng & Units 01/01/2018 12/31/2017 12/30/2017  WBC 4.0 - 10.5 K/uL 8.0 8.9 10.4  Hemoglobin 13.0 - 17.0 g/dL 10.3(L) 10.6(L) 11.0(L)  Hematocrit 39.0 - 52.0 % 32.7(L) 32.6(L) 33.5(L)  Platelets 150 - 400 K/uL 278 214 215   CMP Latest Ref Rng & Units 01/01/2018 12/31/2017 12/30/2017  Glucose 70 - 99 mg/dL 248(H) 191(H) 188(H)  BUN 6 - 20 mg/dL 20 11 12   Creatinine 0.61 - 1.24 mg/dL 1.02 1.08 1.07  Sodium 135 - 145 mmol/L 136 133(L) 132(L)  Potassium 3.5 - 5.1 mmol/L 4.1 3.5 3.8  Chloride 98 - 111 mmol/L 107 103 104  CO2 22 - 32 mmol/L 24 23 22   Calcium 8.9 - 10.3 mg/dL 7.6(L) 7.8(L) 7.8(L)  Total Protein 6.5 - 8.1 g/dL - -  -  Total Bilirubin 0.3 - 1.2 mg/dL - - -  Alkaline Phos 38 - 126 U/L - - -  AST 15 - 41 U/L - - -  ALT 0 - 44 U/L - - -   Assessment/Plan: (ICD-10's: C18.9) 47 y.o. male who is overall doing well 1 Day Post-Op s/p sigmoid colectomy and end colostomy creation for adenocarcinoma of the sigmoid colon diagnosed by colonoscopy on 12/11 by Dr Gustavo Lah, complicated by pertinent comorbidities including Stage II CKD,HTN,and DM.   - Advance to clear liquid diet + boost breeze, IVF   - Monitor abdominal examination and on-going bowel function  - Discontinue foley catheter  - Continue JP drain  - Pain control as needed (minimize narcotics when possible)  - Encouraged mobilization   - Medical management of co-morbidities   - DVT prophylaxis   All of the above findings and recommendations were discussed with the patient, patient's family, and the medical team, and all of patient's and his family's questions were answered to their expressed satisfaction.  -- [name], PA-C [facility] 01/01/2018, 7:50 AM [phone] M-F: 7am - 4pm

## 2018-01-01 NOTE — Consult Note (Signed)
[place] Nurse ostomy follow up Pt and family members at bedside participated in pouch change demonstration and teaching session on this first post-op day. Stoma type/location: Colostomy stoma is red and viable, above skin level, 1 1/2 inches Peristomal assessment: Intact skin surrounding Output: No stool or flatus at this time Ostomy pouching: 2pc. (2 3/4)  Education provided:  Daughter and wife assisted with pouch change while patient watched.  He asked appropriate questions and all were able to open and close velcro to empty.  Reviewed pouching routines and ordering supplies.  Educational materials and 4 extra sets of wafers and pouches left in room. Enrolled patient in [place] Start Discharge program: Yes [place] team will continue teaching sessions on Monday. Julien Girt MSN, RN, [place], [place], [place]

## 2018-01-01 NOTE — Progress Notes (Signed)
Inpatient Diabetes Program Recommendations  AACE/ADA: New Consensus Statement on Inpatient Glycemic Control (2015)  Target Ranges:  Prepandial:   less than 140 mg/dL      Peak postprandial:   less than 180 mg/dL (1-2 hours)      Critically ill patients:  140 - 180 mg/dL   Lab Results  Component Value Date   GLUCAP 206 (H) 01/01/2018    Review of Glycemic ControlResults for JENSON, BEEDLE (MRN [id number]) as of 01/01/2018 11:48  Ref. Range 12/31/2017 23:58 01/01/2018 04:02 01/01/2018 07:50  Glucose-Capillary Latest Ref Range: 70 - 99 mg/dL 225 (H) 207 (H) 206 (H)   Diabetes history:DM 2 Outpatient Diabetes medications: None Current orders for Inpatient glycemic control: Novolog sensitive q 4 hours Inpatient Diabetes Program Recommendations:    Blood sugars continue to be >goal.  Consider adding Lantus 10 units daily while in the hospital.  Thanks,  Adah Perl, RN, BC-ADM Inpatient Diabetes Coordinator Pager [phone] (8a-5p)

## 2018-01-01 NOTE — Plan of Care (Signed)
  Problem: Education: Goal: Knowledge of General Education information will improve Description Including pain rating scale, medication(s)/side effects and non-pharmacologic comfort measures Outcome: Progressing   Problem: Clinical Measurements: Goal: Ability to maintain clinical measurements within normal limits will improve Outcome: Progressing Goal: Will remain free from infection Outcome: Progressing Goal: Diagnostic test results will improve Outcome: Progressing Goal: Respiratory complications will improve Outcome: Progressing Goal: Cardiovascular complication will be avoided Outcome: Progressing   Problem: Activity: Goal: Risk for activity intolerance will decrease Outcome: Progressing Ambulating in halls   Problem: Nutrition: Goal: Adequate nutrition will be maintained Outcome: Progressing Tolerating diet.

## 2018-01-02 LAB — GLUCOSE, CAPILLARY
Glucose-Capillary: 126 mg/dL — ABNORMAL HIGH (ref 70–99)
Glucose-Capillary: 135 mg/dL — ABNORMAL HIGH (ref 70–99)
Glucose-Capillary: 162 mg/dL — ABNORMAL HIGH (ref 70–99)
Glucose-Capillary: 176 mg/dL — ABNORMAL HIGH (ref 70–99)
Glucose-Capillary: 186 mg/dL — ABNORMAL HIGH (ref 70–99)
Glucose-Capillary: 230 mg/dL — ABNORMAL HIGH (ref 70–99)

## 2018-01-02 NOTE — Progress Notes (Signed)
Patient seen on rounds.  He reports flatus, confirmed by RN.  Tolerating clears.  Vitals:   01/02/18 0427 01/02/18 0800  BP: 121/81 119/89  Pulse: 82 78  Resp: 18   Temp: (!) 97.5 F (36.4 C)   SpO2: 97% 98%    Intake/Output Summary (Last 24 hours) at 01/02/2018 1242 Last data filed at 01/02/2018 1106 Gross per 24 hour  Intake 3060.03 ml  Output 1025 ml  Net 2035.03 ml   Focused abdominal exam: soft, NT/ND. Ostomy pink and patent with small amount of gas in bag. JP with SS drainage.  Dressing still in situ.    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 Doing well, now with ROBF.  Offered to advance diet, however he prefers to remain on clears for today. Continue ambulation.

## 2018-01-03 LAB — GLUCOSE, CAPILLARY
Glucose-Capillary: 108 mg/dL — ABNORMAL HIGH (ref 70–99)
Glucose-Capillary: 114 mg/dL — ABNORMAL HIGH (ref 70–99)
Glucose-Capillary: 131 mg/dL — ABNORMAL HIGH (ref 70–99)
Glucose-Capillary: 143 mg/dL — ABNORMAL HIGH (ref 70–99)
Glucose-Capillary: 162 mg/dL — ABNORMAL HIGH (ref 70–99)
Glucose-Capillary: 95 mg/dL (ref 70–99)

## 2018-01-03 MED ORDER — ONDANSETRON HCL 4 MG/2ML IJ SOLN
4.0000 mg | Freq: Four times a day (QID) | INTRAMUSCULAR | Status: DC | PRN
  Administered 2018-01-03: 4 mg via INTRAVENOUS
  Filled 2018-01-03: qty 2

## 2018-01-03 MED ORDER — GUAIFENESIN ER 600 MG PO TB12
600.0000 mg | ORAL_TABLET | Freq: Two times a day (BID) | ORAL | Status: DC
  Administered 2018-01-03 – 2018-01-04 (×2): 600 mg via ORAL
  Filled 2018-01-03 (×2): qty 1

## 2018-01-03 MED ORDER — DM-GUAIFENESIN ER 30-600 MG PO TB12: 1.0000 | ORAL_TABLET | Freq: Two times a day (BID) | ORAL | Status: DC

## 2018-01-03 NOTE — Plan of Care (Signed)
Patient tolerating clear liquid diet well with minimal pain.  Ambulating in hallway and passing gas.

## 2018-01-03 NOTE — Plan of Care (Signed)
01/03/2018 3:23 PM  Mr. Hippe continues to have a fair appetite with intermittent nausea. Zofran ordered PRN and given when needed. Patient's diet advanced to full liquids. Encouraged patient to slowly eat, encourage oral fluids and continued ambulation. Will continue to monitor.   Fuller Mandril, RN

## 2018-01-03 NOTE — Progress Notes (Signed)
Patient of Dr. [name] seen on rounds today.  He tolerated his clear liquid diet without difficulty yesterday.  He does complain of decreased appetite, mainly secondary to food not tasting good.  He continues to pass gas into his ostomy bag.  No stool yet.  He is interested in trying to advance his diet today.  Vitals:   01/02/18 2045 01/03/18 0419  BP: 122/76 133/78  Pulse: 73 71  Resp: 20 18  Temp: 97.9 F (36.6 C) (!) 97.5 F (36.4 C)  SpO2: 99% 99%   I/O last 3 completed shifts: In: 4439.7 [P.O.:25; I.V.:4233.6; IV Piggyback:181.1] Out: 2465 [Urine:2125; Drains:310; Stool:30] Total I/O In: 565.4 [I.V.:516.7; IV Piggyback:48.7] Out: 9735 [Urine:1075]  Focused abdominal exam: Abdomen is soft nontender and nondistended.  His ostomy is pink and patent.  There is currently no gas in the bag however the patient states that it was just deflated.  His JP drain has a small amount of serosanguineous fluid present.  Impression and plan: This is a 47 year old man who is status post Hartmann procedure for obstructing colon cancer.  He is doing well and has tolerated a clear liquid diet.  We will advance him to a full liquid diet.  He should continue ambulating.  He and his wife had a number of questions, specifically regarding the next steps in his care.  I told him that without his final pathology, we would not be able to make that determination at this point.  They expressed understanding and are willing to wait for more information.

## 2018-01-04 LAB — BASIC METABOLIC PANEL
Anion gap: 6 (ref 5–15)
BUN: 9 mg/dL (ref 6–20)
CO2: 25 mmol/L (ref 22–32)
Calcium: 8 mg/dL — ABNORMAL LOW (ref 8.9–10.3)
Chloride: 106 mmol/L (ref 98–111)
Creatinine, Ser: 0.92 mg/dL (ref 0.61–1.24)
GFR calc Af Amer: >60 mL/min (ref >60)
GFR calc non Af Amer: >60 mL/min (ref >60)
Glucose, Bld: 139 mg/dL — ABNORMAL HIGH (ref 70–99)
Potassium: 4.2 mmol/L (ref 3.5–5.1)
Sodium: 137 mmol/L (ref 135–145)

## 2018-01-04 LAB — CBC
HCT: 31.9 % — ABNORMAL LOW (ref 39.0–52.0)
Hemoglobin: 9.9 g/dL — ABNORMAL LOW (ref 13.0–17.0)
MCH: 27.3 pg (ref 26.0–34.0)
MCHC: 31 g/dL (ref 30.0–36.0)
MCV: 87.9 fL (ref 80.0–100.0)
Platelets: 269 10*3/uL (ref 150–400)
RBC: 3.63 MIL/uL — ABNORMAL LOW (ref 4.22–5.81)
RDW: 12 % (ref 11.5–15.5)
WBC: 4.5 10*3/uL (ref 4.0–10.5)
nRBC: 0 % (ref 0.0–0.2)

## 2018-01-04 LAB — GLUCOSE, CAPILLARY
Glucose-Capillary: 125 mg/dL — ABNORMAL HIGH (ref 70–99)
Glucose-Capillary: 129 mg/dL — ABNORMAL HIGH (ref 70–99)
Glucose-Capillary: 131 mg/dL — ABNORMAL HIGH (ref 70–99)
Glucose-Capillary: 168 mg/dL — ABNORMAL HIGH (ref 70–99)

## 2018-01-04 LAB — PHOSPHORUS: Phosphorus: 3.2 mg/dL (ref 2.5–4.6)

## 2018-01-04 LAB — MAGNESIUM: MAGNESIUM: 2 mg/dL (ref 1.7–2.4)

## 2018-01-04 MED ORDER — OXYCODONE-ACETAMINOPHEN 5-325 MG PO TABS: 1.0000 | ORAL_TABLET | ORAL | 0 refills | Status: DC | PRN

## 2018-01-04 MED ORDER — AMOXICILLIN-POT CLAVULANATE 875-125 MG PO TABS: 1.0000 | ORAL_TABLET | Freq: Two times a day (BID) | ORAL | 0 refills | Status: AC

## 2018-01-04 NOTE — Discharge Summary (Signed)
Physician Discharge Summary  Patient ID: HAGER COMPSTON MRN: [id number] DOB/AGE: 01-26-1970 47 y.o.  Admit date: 12/28/2017 Discharge date: 01/04/2018  Admission Diagnoses:  Discharge Diagnoses:  Active Problems:   Malignant neoplasm of sigmoid colon [facility])  Discharged Condition: good  Hospital Course: 47 y.o. male presented to [facility] ED for severe abdominal pain. Workup was found to be significant for CT imaging demonstrating distal sigmoid colitis following recent colonoscopy, which demonstrated near-obstructing mass, which was found on pathology to be consistent with invasive adenocarcinoma. Informed consent was obtained and documented, and patient underwent sigmoid colectomy with takedown of splenic flexure and creation of end colostomy due to distal sigmoid colitis and proximal rectal proctitis attributed to bathing of tissues in deep pelvic and RUQ peritoneal abscesses ([id number], 12/31/2017).  Post-operatively, patient's pain and bowel function both improved, and advancement of patient's diet and ambulation were well-tolerated. The remainder of patient's hospital course was essentially unremarkable, and discharge planning was initiated accordingly with patient safely able to be discharged home with appropriate discharge instructions, antibiotics, pain control, and outpatient surgical follow-up after all of his and family's questions were answered to their expressed satisfaction.  Consults: None  Significant Diagnostic Studies: radiology: CT scan: Near-obstructed sigmoid mass, consistent with recently colonoscopic biopsy-proven invasive adenocarcinoma of the sigmoid colon  Treatments: IV hydration, antibiotics: Zosyn and surgery: Sigmoid colectomy with creation of end colostomy  Discharge Exam: Blood pressure 122/78, pulse 77, temperature 97.7 F (36.5 C), temperature source Oral, resp. rate 20, height 5\' 7"  (1.702 m), weight 90.2 kg, SpO2 99 %. General appearance: alert, cooperative,  appears stated age and no distress GI: abdomen soft and non-distended with minimal peri-incisional tenderness to palpation, no surrounding erythema or drainage  Disposition: Discharge disposition: 01-Home or Self Care        Allergies as of 01/04/2018   No Known Allergies     Medication List    STOP taking these medications   HYDROcodone-acetaminophen 5-325 MG tablet Commonly known as:  NORCO     TAKE these medications   amoxicillin-clavulanate 875-125 MG tablet Commonly known as:  AUGMENTIN Take 1 tablet by mouth 2 (two) times daily for 5 days.   lisinopril 20 MG tablet Commonly known as:  PRINIVIL,ZESTRIL Take 20 mg by mouth daily.   meloxicam 15 MG tablet Commonly known as:  MOBIC Take 1 tablet (15 mg total) by mouth daily.   oxyCODONE-acetaminophen 5-325 MG tablet Commonly known as:  PERCOCET/ROXICET Take 1 tablet by mouth every 4 (four) hours as needed for severe pain.      Follow-up Information    Vickie Epley, MD. Go on 01/12/2018.   Specialty:  General Surgery Why:  @11am  Contact information: [address] [place] [address] [phone]           Signed: Vickie Epley 01/04/2018, 4:45 PM

## 2018-01-04 NOTE — Progress Notes (Signed)
Discharge instructions explained to pt and pts spouse/ verbalized an understanding/ care instructions shown and explained on JP drain and ostomy care/ iv removed/ will transport off unit via wheelchair.

## 2018-01-04 NOTE — Care Management Note (Signed)
Case Management Note  Patient Details  Name: Adam Farrell MRN: [id number] Date of Birth: 13-Jul-1970   Patient to discharge home today.  Patient s/p  s/p Hartmann procedure with ostomy.  Patient lives at home with wife.  PCP Feldp[place]ch. Prior to admission patient independent of all ADLs.  Patient has been seen bit the [facility] RN and provided education to him and his wife.  Patient declines any home health services at discharge.  Patient states "we should be fine.  We both feel pretty good about it, and my wife doesn't work so she will be with me all the time for support.".  RNCM informed patient that should he change his mind, and there is still skilled need home health services could be set up from his outpatient office.     Subjective/Objective:                    Action/Plan:   Expected Discharge Date:  01/04/18               Expected Discharge Plan:     In-House Referral:     Discharge planning Services     Post Acute Care Choice:    Choice offered to:     DME Arranged:    DME Agency:     HH Arranged:    HH Agency:     Status of Service:     If discussed at [employer], dates discussed:    Additional Comments:  Beverly Sessions, RN 01/04/2018, 2:45 PM

## 2018-01-04 NOTE — Consult Note (Signed)
[place] Nurse ostomy follow up Patient receiving care in [facility].  Spouse present. Stoma type/location: LUQ colostomy Stomal assessment/size: 1.5 inches, budded, red, moist, edematous, all suture intact. Peristomal assessment: Normal color and texture Treatment options for stomal/peristomal skin: Barrier ring Output: 40 ml pudding consistency, brown Ostomy pouching: 2pc. Flat (2 3/4 inch) Education provided: Patient removed his existing pouch, cleaned around the stoma.  His spouse partially cut out the opening for the new barrier.  Both of them can open, close, and burp the pouch.  Additional pouches, barriers, and barrier rings provided for discharge home. Enrolled patient in [place] Start Discharge program: Yes, last week. Val Riles, RN, MSN, CWOCN, CNS-BC, pager [phone]

## 2018-01-04 NOTE — Discharge Instructions (Addendum)
In addition to included general post-operative instructions for Open Colectomy with Colostomy and Surgically Placed Drain Care,  Diet: Gradually resume home heart healthy diet.   Activity: No heavy lifting >15 - 20 pounds (children, pets, laundry, garbage) or strenuous activity until follow-up, but light activity and walking are encouraged. Do not drive or drink alcohol if taking narcotic pain medications.  Wound care: You may shower/get incision wet with soapy water and pat dry (do not rub incisions), but no baths or submerging incision underwater until follow-up.   Medications: Resume all home medications and complete prescribed antibiotics even if feeling better/well. For mild to moderate pain: acetaminophen (Tylenol) or ibuprofen/naproxen (if no kidney disease). Combining Tylenol with alcohol can substantially increase your risk of causing liver disease. Narcotic pain medications, if prescribed, can be used for severe pain, though may cause nausea, constipation, and drowsiness. Do not combine Tylenol and Percocet (or similar) within a 6 hour period as Percocet (and similar) contain(s) Tylenol. If you do not need the narcotic pain medication, you do not need to fill the prescription.  Call office [phone]) at any time if any questions, worsening pain, fevers/chills, bleeding, drainage from incision site, or other concerns.

## 2018-01-06 LAB — AEROBIC/ANAEROBIC CULTURE W GRAM STAIN (SURGICAL/DEEP WOUND)

## 2018-01-07 ENCOUNTER — Telehealth: Payer: Self-pay

## 2018-01-07 NOTE — Telephone Encounter (Signed)
Adam Farrell has been discharged from the hospital. Message sent to new patient refferal coordinator that she can now arrange for his medical oncology consult. Oncology Nurse Navigator Documentation  Navigator Location: [facility] (01/07/18 0900)   )Navigator Encounter Type: Other (01/07/18 0900)                                                    Time Spent with Patient: 15 (01/07/18 0900)

## 2018-01-08 ENCOUNTER — Telehealth: Payer: Self-pay | Admitting: Surgery

## 2018-01-08 ENCOUNTER — Telehealth: Payer: Self-pay

## 2018-01-08 LAB — SURGICAL PATHOLOGY

## 2018-01-08 NOTE — Telephone Encounter (Signed)
Patient has called this morning. Patient had a surgery with Dr Maxwell Caul assist on 12/31/17-sigmoid colectomy,drainage of pelvic and peritoneal abscess and end colostomy.   Patient states that he has a drain and says that it is leaking-right side. There is still gauze around the site. Patient states he empties the bulb once a day and says that it has been less than 25 cc daily.   Please call patient at [phone].

## 2018-01-08 NOTE — Telephone Encounter (Signed)
EMMI Follow-up: Noted on the report that the patient did not know who to contact if there was a change in his condition.  I tried calling Adam Farrell but his voice mailbox was full.  Will try calling again.

## 2018-01-08 NOTE — Telephone Encounter (Signed)
Spoke with patient and he stated at times there is some leakage from his drain site.   Patient denies redness or pus. No fever and output is around 25cc daily.   His follow-up appointment is 01/12/18 with Dr.Davis.  Patient stated he has a cold and has been coughing a lot and thinks this could be causing the leakage.  He has an appointment with PCP today and will have the doctor to look at the drain site as well.

## 2018-01-11 DIAGNOSIS — K5669 Other partial intestinal obstruction: Secondary | ICD-10-CM

## 2018-01-12 ENCOUNTER — Telehealth: Payer: Self-pay

## 2018-01-12 ENCOUNTER — Encounter: Payer: Self-pay | Admitting: Surgery

## 2018-01-12 ENCOUNTER — Ambulatory Visit (INDEPENDENT_AMBULATORY_CARE_PROVIDER_SITE_OTHER): Payer: BLUE CROSS/BLUE SHIELD | Admitting: Surgery

## 2018-01-12 ENCOUNTER — Other Ambulatory Visit: Payer: Self-pay

## 2018-01-12 VITALS — BP 130/68 | HR 72 | Temp 97.8°F | Resp 13 | Ht 68.0 in | Wt 192.0 lb

## 2018-01-12 DIAGNOSIS — Z4889 Encounter for other specified surgical aftercare: Secondary | ICD-10-CM

## 2018-01-12 DIAGNOSIS — C187 Malignant neoplasm of sigmoid colon: Secondary | ICD-10-CM

## 2018-01-12 NOTE — Telephone Encounter (Signed)
FMLA paperwork completed and placed in scan folder.

## 2018-01-12 NOTE — Progress Notes (Signed)
Surgical Clinic Progress/Follow-up Note   HPI:  47 y.o. Male presents to clinic for post-op follow-up 12 Days s/p open sigmoid colectomy with takedown of the splenic flexure and creation of end-colostomy [name], 12/31/2017) for nearly obstructinginvasive adenocarcinoma of the sigmoid colon and multiple pelvic and peritoneal abscesses along with sigmoid colitis and likely with microperforation following recent colonoscopy Gustavo Lah, 12/11) for bright red blood per rectum. Patient reports he's been feeling overall well with negligible post-surgical abdominal pain and has been tolerating regular diet with +flatus and and soft pasty stool into colostomy bag without skin irritation or ostomy bag leaking. He denies N/V, fever/chills, CP, or SOB. He reports his RLQ abdominal drain has been draining ~10 mL of thin clear pink fluid per day, though 25 mL in his drain bulb since patient says he forgot to empty his drain yesterday. A small amount of similar fluid has drained around tubing. Of note, patient also describes that his primary care physician recently changed his antibiotics to treat bronchitis with patient saying he'd already completed all but 3 post-operative antibiotic tablets when switched. Of note, patient lastly reiterates he is eager for reversal of his colostomy, to which he has attributed some anxiety.  Review of Systems:  Constitutional: denies fever/chills  Respiratory: denies shortness of breath, wheezing  Cardiovascular: denies chest pain, palpitations  Gastrointestinal: abdominal pain, N/V, and bowel function as per interval history Skin: Denies any other rashes or skin discolorations except post-surgical wounds as per interval history  Vital Signs:  BP 130/68   Pulse 72   Temp 97.8 F (36.6 C) (Skin)   Resp 13   Ht 5\' 8"  (1.727 m)   Wt 192 lb (87.1 kg)   SpO2 99%   BMI 29.19 kg/m    Physical Exam:  Constitutional:  -- Normal body habitus  -- Awake, alert, and oriented x3   Pulmonary:  -- No crackles -- Equal breath sounds bilaterally -- Breathing non-labored at rest Cardiovascular:  -- S1, S2 present  -- No pericardial rubs  Gastrointestinal:  -- Soft and non-distended, non-tender to palpation, no guarding/rebound tenderness -- Post-surgical incisions all well-approximated without any peri-incisional erythema or drainage -- RLQ post-surgical drain well-secured without surrounding erythema, 25 mL thin translucent serosanguinous fluid in bulb and tubing -- Left-sided colostomy pink and functional without parastomal hernia -- No abdominal masses appreciated, pulsatile or otherwise  Musculoskeletal / Integumentary:  -- Wounds or skin discoloration: None appreciated except post-surgical incisions as described above (GI) -- Extremities: B/L UE and LE FROM, hands and feet warm, no edema   Imaging: No new pertinent imaging available for review  Pathology (12/31/2017): A. GREATER OMENTUM; EXCISION:  - DIFFUSE ACUTE PERITONITIS WITH EDEMA AND HEMORRHAGE.   B. SIGMOID COLON; SEGMENTAL COLECTOMY:  - ADENOCARCINOMA INVADING THROUGH MUSCULARIS PROPRIA. - EXTENSIVE ACUTE PERITONITIS WITH PERICOLIC ABSCESS.  - 20 REGIONAL LYMPH NODES NEGATIVE FOR MALIGNANCY (0/20), WITH ACUTE LYMPHADENITIS.   Pathologic Stage Classification (pTNM, AJCC 8th Edition): pT3 pN0  Tumor Size: Greatest dimension: 6.3 cm  Histologic Type: Adenocarcinoma  Histologic Grade: G2, moderately differentiated  Tumor Extension: Tumor invades through the muscularis propria into  peri-colorectal tissue  Margins: All margins are uninvolved by invasive carcinoma, high-grade  dysplasia, intramucosal adenocarcinoma, and adenoma    Margins examined: Proximal, distal, and mesenteric  Assessment:  47 y.o. yo Male with a problem list including...  Patient Active Problem List   Diagnosis Date Noted  . Other partial intestinal obstruction ([place])   . Malignant neoplasm of sigmoid colon (  [place])  12/28/2017    presents to clinic for post-op follow-up evaluation, doing overall well despite resolving bronchitis 12 Days s/p open sigmoid colectomy with takedown of the splenic flexure and creation end-colostomy [name], 12/31/2017) for nearly obstructinginvasive adenocarcinoma of sigmoid colon and multiple abscesses along with sigmoid colitis and likely with microperforation following recent colonoscopy Gustavo Lah, 12/11) for bright red blood per rectum.  Plan:              - advance diet as tolerated  - pathology results discussed with patient and his wife  - will referral to medical oncology, anticipate genetic testing  - RLQ drain removed, lower midline staples removed, steri-strips applied             - okay to remove RLQ drain site gauze in 2 days, after which may shower             - no heavy lifting >40 lbs x 4 more weeks, after which gradually resume all activities without restrictions             - apply sunblock particularly to incisions with sun exposure to reduce pigmentation of scars             - return to clinic in 3 months to discuss colonoscopy and reversal ~6 months post-op  - instructed to call office if any questions or concerns  All of the above recommendations were discussed with the patient and patient's wife, and all of patient's and family's questions were answered to their expressed satisfaction.  -- [name] [name], MD, [place]: [facility] - Partnering for exceptional care. Office: [phone]

## 2018-01-12 NOTE — Patient Instructions (Addendum)
Return in three months. The patient is aware to call back for any questions or concerns. We have referred you to medical oncology. They will contact you to schedule an appointment.

## 2018-01-18 ENCOUNTER — Inpatient Hospital Stay: Payer: BLUE CROSS/BLUE SHIELD | Attending: Hematology and Oncology | Admitting: Hematology and Oncology

## 2018-01-18 ENCOUNTER — Telehealth: Payer: Self-pay

## 2018-01-18 ENCOUNTER — Encounter: Payer: Self-pay | Admitting: Hematology and Oncology

## 2018-01-18 ENCOUNTER — Inpatient Hospital Stay: Payer: BLUE CROSS/BLUE SHIELD

## 2018-01-18 VITALS — BP 127/72 | HR 88 | Temp 98.1°F | Resp 18 | Ht 68.0 in | Wt 189.6 lb

## 2018-01-18 DIAGNOSIS — Z9049 Acquired absence of other specified parts of digestive tract: Secondary | ICD-10-CM | POA: Insufficient documentation

## 2018-01-18 DIAGNOSIS — N182 Chronic kidney disease, stage 2 (mild): Secondary | ICD-10-CM

## 2018-01-18 DIAGNOSIS — Z7984 Long term (current) use of oral hypoglycemic drugs: Secondary | ICD-10-CM | POA: Insufficient documentation

## 2018-01-18 DIAGNOSIS — I129 Hypertensive chronic kidney disease with stage 1 through stage 4 chronic kidney disease, or unspecified chronic kidney disease: Secondary | ICD-10-CM | POA: Diagnosis not present

## 2018-01-18 DIAGNOSIS — Z800 Family history of malignant neoplasm of digestive organs: Secondary | ICD-10-CM | POA: Diagnosis not present

## 2018-01-18 DIAGNOSIS — Z933 Colostomy status: Secondary | ICD-10-CM | POA: Diagnosis not present

## 2018-01-18 DIAGNOSIS — C187 Malignant neoplasm of sigmoid colon: Secondary | ICD-10-CM

## 2018-01-18 DIAGNOSIS — Z794 Long term (current) use of insulin: Secondary | ICD-10-CM

## 2018-01-18 DIAGNOSIS — Z79899 Other long term (current) drug therapy: Secondary | ICD-10-CM

## 2018-01-18 DIAGNOSIS — E1122 Type 2 diabetes mellitus with diabetic chronic kidney disease: Secondary | ICD-10-CM | POA: Diagnosis not present

## 2018-01-18 DIAGNOSIS — Z791 Long term (current) use of non-steroidal anti-inflammatories (NSAID): Secondary | ICD-10-CM

## 2018-01-18 DIAGNOSIS — G893 Neoplasm related pain (acute) (chronic): Secondary | ICD-10-CM | POA: Diagnosis not present

## 2018-01-18 LAB — COMPREHENSIVE METABOLIC PANEL
ALK PHOS: 72 U/L (ref 38–126)
ALT: 21 U/L (ref 0–44)
AST: 21 U/L (ref 15–41)
Albumin: 3.6 g/dL (ref 3.5–5.0)
Anion gap: 9 (ref 5–15)
BILIRUBIN TOTAL: 0.6 mg/dL (ref 0.3–1.2)
BUN: 13 mg/dL (ref 6–20)
CO2: 27 mmol/L (ref 22–32)
Calcium: 8.9 mg/dL (ref 8.9–10.3)
Chloride: 104 mmol/L (ref 98–111)
Creatinine, Ser: 1.09 mg/dL (ref 0.61–1.24)
GFR calc Af Amer: >60 mL/min (ref >60)
GFR calc non Af Amer: >60 mL/min (ref >60)
Glucose, Bld: 147 mg/dL — ABNORMAL HIGH (ref 70–99)
Potassium: 3.8 mmol/L (ref 3.5–5.1)
Sodium: 140 mmol/L (ref 135–145)
TOTAL PROTEIN: 8.2 g/dL — AB (ref 6.5–8.1)

## 2018-01-18 LAB — CBC WITH DIFFERENTIAL/PLATELET
Abs Immature Granulocytes: 0.02 10*3/uL (ref 0.00–0.07)
Basophils Absolute: 0 10*3/uL (ref 0.0–0.1)
Basophils Relative: 1 %
Eosinophils Absolute: 0.2 10*3/uL (ref 0.0–0.5)
Eosinophils Relative: 3 %
HCT: 36.5 % — ABNORMAL LOW (ref 39.0–52.0)
Hemoglobin: 12.1 g/dL — ABNORMAL LOW (ref 13.0–17.0)
Immature Granulocytes: 0 %
Lymphocytes Relative: 29 %
Lymphs Abs: 1.4 10*3/uL (ref 0.7–4.0)
MCH: 27.5 pg (ref 26.0–34.0)
MCHC: 33.2 g/dL (ref 30.0–36.0)
MCV: 83 fL (ref 80.0–100.0)
Monocytes Absolute: 0.3 10*3/uL (ref 0.1–1.0)
Monocytes Relative: 6 %
Neutro Abs: 3 10*3/uL (ref 1.7–7.7)
Neutrophils Relative %: 61 %
Platelets: 200 10*3/uL (ref 150–400)
RBC: 4.4 MIL/uL (ref 4.22–5.81)
RDW: 12.6 % (ref 11.5–15.5)
WBC: 5 10*3/uL (ref 4.0–10.5)
nRBC: 0 % (ref 0.0–0.2)

## 2018-01-18 LAB — IRON AND TIBC
Iron: 58 ug/dL (ref 45–182)
Saturation Ratios: 20 % (ref 17.9–39.5)
TIBC: 286 ug/dL (ref 250–450)
UIBC: 228 ug/dL

## 2018-01-18 LAB — FERRITIN: Ferritin: 566 ng/mL — ABNORMAL HIGH (ref 24–336)

## 2018-01-18 NOTE — Progress Notes (Signed)
[facility] day:  01/18/2018  Chief Complaint: Adam Farrell is a 48 y.o. male with stage IIA sigmoid colon cancer who is referred in consultation by the ER for assessment and management.  HPI:  The patient was seen in the [facility] for consultation on 12/14/2017. He had some red and maroon stools associated with abdominal pain.    He underwent upper and lower endoscopy on 12/23/2017 by Dr. Gustavo Lah.  EGD revealed reflux gastritis.  There was no H pylori, Barretts, dysplasia, malignancy, or celiac sprue.  Colonoscopy revealed fungating and infiltrative partially obstructing large mass 32 cm proximal to the anus.  The mass was 3.5 cm in length and circumferential.  The mass could not be passed with a pediatric scope.  Pathology confirmed invasive adenocarcinoma of the left distal colon.    The patient was admitted to [facility] from 12/28/2017 - 01/04/2018.  He presented with severe abdominal pain.  Chest, abdomen and pelvic CT on 12/28/2017 revealed acute inflammatory changes and free fluid associated with the sigmoid colon in the mesentery, centered at the site of the sigmoid colon mass, which is reported to have been recently biopsied during colonoscopy. The differential included complicating features of colonoscopy/biopsy, infectious/inflammatory colitis, or potentially a perforating colon cancer which pre-existed the recent colonoscopy.  There was circumferential thickening of the sigmoid colon, compatible with colon cancer. There were multiple small nodules/lymph nodes of the sigmoid mesentery which extended along the inferior mesenteric neurovascular drainage pathway. These may be pathologic with lymphatic extension versus reactive/inflammatory changes.  He underwent sigmoid colectomy with takedown of splenic flexure and creation of end colostomy due to distal sigmoid colitis and proximal rectal proctitis attributed to bathing of tissues in deep  pelvic and RUQ peritoneal abscesses by Dr. Tama High on 12/31/2017.  Pathology revealed a 6.3 cm grade II sigmoid adenocarcinoma.  Tumor invaded the muscularis propria into the peri-colorectal tissue.  All margins were negative.  There was no lymphovascular invasion or perineural invasion.  There were no tumor deposits.  Zero of 20 lymph nodes were positive.  Greater omentum excision revealed diffuse acute peritonitis with edema and hemorrhage.  Pathologic stage was T3N0.  MMR/MSI was negative/intact.  Patient treated with oral Augmentin from 01/04/2018 - 01/09/2018. He presented to his PCP on 01/08/2018 and was advised that he had a "touch of bronchitis", for which he was prescribed a 10 day course of levofloxacin.   Symptomatically, patient feels "fine". He notes some peri-incisional pain. Patient has recently lost about 22 pounds from initial presentation to the ED until now. He has regained 7 pounds post-operatively. Weight today is 189 lb 9.5 oz (86 kg). Respiratory symptoms have resolved. No nausea, vomiting, diarrhea, or fevers. Patient denies bleeding; no hematochezia, melena, or gross hematuria. He has a colostomy to his LEFT lower quadrant.   He notes recent age related visual changes. He denies any headaches or focal neurological symptoms.   Family history is notable for an aunt with celiac.  Patient has a history of colon cancer in a paternal aunt (age 57). Information is limited due to adoptions in his family.   Past Medical History:  Diagnosis Date  . CKD (chronic kidney disease), stage II   . Colon cancer ([place])   . Diabetes mellitus without complication ([place])   . Hypertension     Past Surgical History:  Procedure Laterality Date  . COLON RESECTION SIGMOID N/A 12/31/2017   Procedure: COLON RESECTION SIGMOID;  Surgeon:  Vickie Epley, MD;  Location: [facility] ORS;  Service: General;  Laterality: N/A;  . COLOSTOMY N/A 12/31/2017   Procedure: COLOSTOMY;  Surgeon: Vickie Epley, MD;  Location: [facility] ORS;  Service: General;  Laterality: N/A;    History reviewed. No pertinent family history.  Social History:  reports that he has never smoked. He has never used smokeless tobacco. He reports current alcohol use. No history on file for drug.  Patient drinks beer, sometimes daily. No tobacco use. Patient denies known exposures to radiation on toxins. 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  He has a 32 year old daughter.  The patient is accompanied by his wife, Suanne Farrell, today.  Allergies: No Known Allergies  Current Medications: Current Outpatient Medications  Medication Sig Dispense Refill  . cetirizine (ZYRTEC) 10 MG tablet Take by mouth.    Marland Kitchen lisinopril (PRINIVIL,ZESTRIL) 20 MG tablet Take 20 mg by mouth daily.    . meloxicam (MOBIC) 15 MG tablet Take 1 tablet (15 mg total) by mouth daily. (Patient not taking: Reported on 12/28/2017) 30 tablet 0  . metFORMIN (GLUCOPHAGE-XR) 500 MG 24 hr tablet Take by mouth.    . oxyCODONE-acetaminophen (PERCOCET/ROXICET) 5-325 MG tablet Take 1 tablet by mouth every 4 (four) hours as needed for severe pain. (Patient not taking: Reported on 01/18/2018) 30 tablet 0   No current facility-administered medications for this visit.     Review of Systems:  GENERAL:  Feels "fine".  No fevers, sweats.  Weight loss of 22 pounds, but back up 7 pounds since surgery. PERFORMANCE STATUS (ECOG):  1 HEENT:  Change in vision.  No runny nose, sore throat, mouth sores or tenderness. Lungs: Bronchitis symptoms, resolved.  No shortness of breath or cough.  No hemoptysis. Cardiac:  No chest pain, palpitations, orthopnea, or PND. GI:  Eating well.  No nausea, vomiting, diarrhea, constipation, melena or hematochezia. GU:  No urgency, frequency, dysuria, or hematuria. Musculoskeletal:  No back pain.  No joint pain.  No muscle tenderness. Extremities:  No pain or swelling. Skin:  No rashes or  skin changes. Neuro:  No headache, numbness or weakness, balance or coordination issues. Endocrine:  No diabetes, thyroid issues, hot flashes or night sweats. Psych:  No mood changes, depression or anxiety. Pain:  Little incisional pain. Review of systems:  All other systems reviewed and found to be negative.  Physical Exam: Blood pressure 127/72, pulse 88, temperature 98.1 F (36.7 C), temperature source Tympanic, resp. rate 18, height '5\' 8"'$  (1.727 m), weight 189 lb 9.5 oz (86 kg), SpO2 100 %. GENERAL:  Well developed, well nourished, gentleman sitting comfortably in the exam room in no acute distress. MENTAL STATUS:  Alert and oriented to person, place and time. HEAD:  Graying hair. Normocephalic, atraumatic, face symmetric, no Cushingoid features. EYES:  Blue eyes.  Pupils equal round and reactive to light and accomodation.  No conjunctivitis or scleral icterus. ENT:  Oropharynx clear without lesion.  Tongue normal. Mucous membranes moist.  RESPIRATORY:  Clear to auscultation without rales, wheezes or rhonchi. CARDIOVASCULAR:  Regular rate and rhythm without murmur, rub or gallop. ABDOMEN:  Midline incision with ster-strips.  Left sided ostomy.  Soft, non-tender, with active bowel sounds, and no hepatosplenomegaly.  No masses. SKIN:  No rashes, ulcers or lesions. EXTREMITIES: No edema, no skin discoloration or tenderness.  No palpable cords. LYMPH NODES: No palpable cervical, supraclavicular, axillary or inguinal adenopathy  NEUROLOGICAL: Unremarkable. PSYCH:  Appropriate.   No visits with results within 3 Day(s) from this visit.  Latest known visit with results is:  No results displayed because visit has over 200 results.      Assessment:  DOLLIE MAYSE is a 48 y.o. male with stage IIA sigmoid colon cancer s/p resection and ostomy on 12/31/2017.  Pathology revealed a 6.3 cm grade II sigmoid adenocarcinoma.  Tumor invaded the muscularis propria into the peri-colorectal tissue.  All  margins were negative.  There was no lymphovascular invasion or perineural invasion.  There were no tumor deposits.  Zero of 20 lymph nodes were positive.  Greater omentum excision revealed diffuse acute peritonitis with edema and hemorrhage.  Pathologic stage was T3N0.  MMR/MSI was negative/intact.  He presented with rectal bleeding.  Colonoscopy on 12/23/2017 revealed fungating and infiltrative partially obstructing large mass 32 cm proximal to the anus.  The mass was 3.5 cm in length and circumferential.  The mass could not be passed with a pediatric scope.  Chest, abdomen and pelvic CT on 12/28/2017 revealed acute inflammatory changes and free fluid associated with the sigmoid colon in the mesentery, centered at the site of the sigmoid colon mass. The differential included complicating features of colonoscopy/biopsy, infectious/inflammatory colitis, or potentially a perforating colon cancer which pre-existed the recent colonoscopy.  There was circumferential thickening of the sigmoid colon, compatible with colon cancer. There were multiple small nodules/lymph nodes of the sigmoid mesentery which extended along the inferior mesenteric neurovascular drainage pathway. These may be pathologic with lymphatic extension versus reactive/inflammatory changes.  Family history is limited.  He has an aunt with colon cancer at age 69.  Symptomatically, he feels fine.  He has mild incisional pain.  Exam reveals a well healing midline incision.  Plan: 1.  Labs today:  CBC with diff, CMP, CEA, ferritin, iron studies, [facility] genetic testing. 2.  Stage IIA colon cancer:  Review pathology.  Tumor is grade II.  No lymph nodes involved.  No evidence of perforation, but concern secondary to initial scans and pathology (diffuse fibrinopurulent material on the external surface).  Discuss high risk features for recurrence: grade III, LVI, perineural invasion, bowel obstruction, < 12 LN removed, positive margins, or  localized perforation.  Discuss consideration of PET scan if CEA elevated.  Discuss consideration of chemotherapy:  Xeloda x 6 months.  Side effects reviewed.  Discuss with Dr. Tama High, surgeon.  Contact clinical trials regarding potential enrollment.  Discuss presentation at tumor board.  Discuss genetic testing. 3.   RTC in 1 week for MD assessment and discussion regarding direction of therapy.    Honor Loh, NP  01/18/2018, 11:40 AM   I saw and evaluated the patient, participating in the key portions of the service and reviewing pertinent diagnostic studies and records.  I reviewed the nurse practitioner's note and agree with the findings and the plan.  The assessment and plan were discussed with the patient.  Multiple questions were asked by the patient and answered.   Nolon Stalls, MD 01/18/2018,11:40 AM

## 2018-01-18 NOTE — Telephone Encounter (Signed)
Contacted [facility] to request Sigmoidoscopy notes.

## 2018-01-19 ENCOUNTER — Encounter: Payer: Self-pay | Admitting: *Deleted

## 2018-01-19 LAB — CEA: CEA: 2.3 ng/mL (ref 0.0–4.7)

## 2018-01-19 NOTE — Progress Notes (Signed)
FMLA papers completed and faxed as requested, return to work date 02-15-18.

## 2018-01-21 ENCOUNTER — Other Ambulatory Visit: Payer: Self-pay

## 2018-01-21 ENCOUNTER — Telehealth: Payer: Self-pay | Admitting: *Deleted

## 2018-01-21 NOTE — Progress Notes (Signed)
Tumor Board Documentation  Adam Farrell was presented by Dr Mike Gip at our Tumor Board on 01/21/2018, which included representatives from radiation oncology, surgical oncology, internal medicine, navigation, pathology, radiology, surgical, research, palliative care, pulmonology.  Ora currently presents as a new patient, for new positive pathology, for [place], for discussion with history of the following treatments: surgical intervention(s).  Additionally, we reviewed previous medical and familial history, history of present illness, and recent lab results along with all available histopathologic and imaging studies. The tumor board considered available treatment options and made the following recommendations: Oncotype testing, chemotherapy  The following procedures/referrals were also placed: No orders of the defined types were placed in this encounter.   Clinical Trial Status: not discussed   Staging used: AJCC Stage Group Stage 2  National site-specific guidelines NCCN were discussed with respect to the case.  Tumor board is a meeting of clinicians from various specialty areas who evaluate and discuss patients for whom a multidisciplinary approach is being considered. Final determinations in the plan of care are those of the provider(s). The responsibility for follow up of recommendations given during tumor board is that of the provider.   Today's extended care, comprehensive team conference, Ferdie was not present for the discussion and was not examined.   Multidisciplinary Tumor Board is a multidisciplinary case peer review process.  Decisions discussed in the Multidisciplinary Tumor Board reflect the opinions of the specialists present at the conference without having examined the patient.  Ultimately, treatment and diagnostic decisions rest with the primary provider(s) and the patient.

## 2018-01-21 NOTE — Telephone Encounter (Signed)
Patient called and is complaining of passing gas out of his rectum, started on Tuesday. Patient had Colon Resection of 12/31/17 with Dr.Davis

## 2018-01-21 NOTE — Telephone Encounter (Signed)
I talked with the patient to let him know this was normal and that he may even notice mucous discharge as well. Appreciates call. The patient is aware to call back for any questions or new concerns.

## 2018-01-23 DIAGNOSIS — Z7189 Other specified counseling: Secondary | ICD-10-CM | POA: Insufficient documentation

## 2018-01-25 ENCOUNTER — Telehealth: Payer: Self-pay

## 2018-01-25 ENCOUNTER — Inpatient Hospital Stay: Payer: BLUE CROSS/BLUE SHIELD | Admitting: Hematology and Oncology

## 2018-01-25 ENCOUNTER — Encounter: Payer: Self-pay | Admitting: Hematology and Oncology

## 2018-01-25 VITALS — BP 134/81 | HR 76 | Temp 97.6°F | Resp 18 | Ht 68.0 in | Wt 189.9 lb

## 2018-01-25 DIAGNOSIS — E119 Type 2 diabetes mellitus without complications: Secondary | ICD-10-CM

## 2018-01-25 DIAGNOSIS — Z7984 Long term (current) use of oral hypoglycemic drugs: Secondary | ICD-10-CM

## 2018-01-25 DIAGNOSIS — Z9049 Acquired absence of other specified parts of digestive tract: Secondary | ICD-10-CM

## 2018-01-25 DIAGNOSIS — D649 Anemia, unspecified: Secondary | ICD-10-CM | POA: Insufficient documentation

## 2018-01-25 DIAGNOSIS — C187 Malignant neoplasm of sigmoid colon: Secondary | ICD-10-CM

## 2018-01-25 DIAGNOSIS — Z791 Long term (current) use of non-steroidal anti-inflammatories (NSAID): Secondary | ICD-10-CM

## 2018-01-25 DIAGNOSIS — G893 Neoplasm related pain (acute) (chronic): Secondary | ICD-10-CM

## 2018-01-25 DIAGNOSIS — Z933 Colostomy status: Secondary | ICD-10-CM | POA: Diagnosis not present

## 2018-01-25 DIAGNOSIS — I10 Essential (primary) hypertension: Secondary | ICD-10-CM

## 2018-01-25 DIAGNOSIS — Z800 Family history of malignant neoplasm of digestive organs: Secondary | ICD-10-CM

## 2018-01-25 DIAGNOSIS — Z79899 Other long term (current) drug therapy: Secondary | ICD-10-CM

## 2018-01-25 DIAGNOSIS — Z7189 Other specified counseling: Secondary | ICD-10-CM

## 2018-01-25 DIAGNOSIS — N182 Chronic kidney disease, stage 2 (mild): Secondary | ICD-10-CM

## 2018-01-25 NOTE — Progress Notes (Signed)
[facility] day:  01/25/2018  Chief Complaint: Adam Farrell is a 48 y.o. male with stage IIA sigmoid colon cancer who is seen for 1 week assessment.  HPI:  The patient was seen last seen in the medical oncology clinic on 01/18/2018 for initial consultation.  He had stage IIA sigmoid colon cancer s/p sigmoid colectomy.  Clinically, there was concern for microperforation (none seen on pathology).  Pathology revealed a 6.3 cm grade II sigmoid adenocarcinoma.  Tumor invaded the muscularis propria into the peri-colorectal tissue.  All margins were negative.  There was no lymphovascular invasion or perineural invasion.  There were no tumor deposits.  Zero of 20 lymph nodes were positive.  Greater omentum excision revealed diffuse acute peritonitis with edema and hemorrhage.  Pathologic stage was T3N0.  MMR/MSI was negative/intact.  Labs revealed a hematocrit of 36.1, hemoglobin 12.1, MCV 83, platelets 200,000, WBC 5000 with an ANC of 300.  Ferritin was 566.  Iron saturation was 20% with a TIBC of 286.  CMP was normal except for a protein of 8.2.  Creatinine was 1.09.  CEA was 2.3.  During the interim, he has done well.  He notes nausea on a couple of mornings.  Overall, he feels good.  He continues to recover from surgery.   Past Medical History:  Diagnosis Date  . CKD (chronic kidney disease), stage II   . Colon cancer ([place])   . Diabetes mellitus without complication ([place])   . Hypertension     Past Surgical History:  Procedure Laterality Date  . COLON RESECTION SIGMOID N/A 12/31/2017   Procedure: COLON RESECTION SIGMOID;  Surgeon: Vickie Epley, MD;  Location: [facility] ORS;  Service: General;  Laterality: N/A;  . COLOSTOMY N/A 12/31/2017   Procedure: COLOSTOMY;  Surgeon: Vickie Epley, MD;  Location: [facility] ORS;  Service: General;  Laterality: N/A;    Family History  Adopted: Yes  Problem Relation Age of Onset  . Colon cancer Paternal Aunt      Social History:  reports that he has never smoked. He has never used smokeless tobacco. He reports current alcohol use. No history on file for drug.  Patient drinks beer, sometimes daily. No tobacco use. Patient denies known exposures to radiation on toxins. 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  He has a 76 year old daughter.  The patient is accompanied by his wife, Adam Farrell, today.  Allergies: No Known Allergies  Current Medications: Current Outpatient Medications  Medication Sig Dispense Refill  . cetirizine (ZYRTEC) 10 MG tablet Take by mouth.    Marland Kitchen lisinopril (PRINIVIL,ZESTRIL) 20 MG tablet Take 20 mg by mouth daily.    . metFORMIN (GLUCOPHAGE-XR) 500 MG 24 hr tablet Take by mouth.    . meloxicam (MOBIC) 15 MG tablet Take 1 tablet (15 mg total) by mouth daily. (Patient not taking: Reported on 12/28/2017) 30 tablet 0  . oxyCODONE-acetaminophen (PERCOCET/ROXICET) 5-325 MG tablet Take 1 tablet by mouth every 4 (four) hours as needed for severe pain. (Patient not taking: Reported on 01/18/2018) 30 tablet 0   No current facility-administered medications for this visit.     Review of Systems:  GENERAL:  Feels good.  No fevers, sweats or weight loss.  Weight stable. PERFORMANCE STATUS (ECOG):  1 HEENT:  No visual changes, runny nose, sore throat, mouth sores or tenderness. Lungs: No shortness of breath or cough.  No hemoptysis. Cardiac:  No chest pain, palpitations, orthopnea, or PND. GI:  Eating well.  No nausea, vomiting, diarrhea, constipation, melena or hematochezia. GU:  No urgency, frequency, dysuria, or hematuria. Musculoskeletal:  No back pain.  No joint pain.  No muscle tenderness. Extremities:  No pain or swelling. Skin:  No rashes or skin changes. Neuro:  No headache, numbness or weakness, balance or coordination issues. Endocrine:  No diabetes, thyroid issues, hot flashes or night sweats. Psych:  No mood changes,  depression or anxiety. Pain:  No focal pain. Review of systems:  All other systems reviewed and found to be negative.   Physical Exam: Blood pressure 134/81, pulse 76, temperature 97.6 F (36.4 C), resp. rate 18, height '5\' 8"'$  (1.727 m), weight 189 lb 14.4 oz (86.1 kg), SpO2 99 %. GENERAL:  Well developed, well nourished, gentleman sitting comfortably in the exam room in no acute distress. MENTAL STATUS:  Alert and oriented to person, place and time. HEAD:  Graying hair with beard.  Normocephalic, atraumatic, face symmetric, no Cushingoid features. EYES:  Blue eyes.  No conjunctivitis or scleral icterus. PSYCH:  Appropriate.    No visits with results within 3 Day(s) from this visit.  Latest known visit with results is:  Appointment on 01/18/2018  Component Date Value Ref Range Status  . Iron 01/18/2018 58  45 - 182 ug/dL Final  . TIBC 01/18/2018 286  250 - 450 ug/dL Final  . Saturation Ratios 01/18/2018 20  17.9 - 39.5 % Final  . UIBC 01/18/2018 228  ug/dL Final   Performed at [facility], [address], [place], [place] [address]  . Ferritin 01/18/2018 566* 24 - 336 ng/mL Final   Performed at [facility], [address] [place], [place] [address]  . CEA 01/18/2018 2.3  0.0 - 4.7 ng/mL Final   Comment: (NOTE)                             Nonsmokers          <3.9                             Smokers             <5.6 [employer] Electrochemiluminescence Immunoassay (ECLIA) Values obtained with different assay methods or kits cannot be used interchangeably.  Results cannot be interpreted as absolute evidence of the presence or absence of malignant disease. Performed At: [facility] [place], [place] [id number] Rush Farmer MD [id number]   . Sodium 01/18/2018 140  135 - 145 mmol/L Final  . Potassium 01/18/2018 3.8  3.5 - 5.1 mmol/L Final  . Chloride 01/18/2018 104  98 - 111 mmol/L Final  . CO2 01/18/2018 27  22 - 32 mmol/L  Final  . Glucose, Bld 01/18/2018 147* 70 - 99 mg/dL Final  . BUN 01/18/2018 13  6 - 20 mg/dL Final  . Creatinine, Ser 01/18/2018 1.09  0.61 - 1.24 mg/dL Final  . Calcium 01/18/2018 8.9  8.9 - 10.3 mg/dL Final  . Total Protein 01/18/2018 8.2* 6.5 - 8.1 g/dL Final  . Albumin 01/18/2018 3.6  3.5 - 5.0 g/dL Final  . AST 01/18/2018 21  15 - 41 U/L Final  . ALT 01/18/2018 21  0 - 44 U/L Final  . Alkaline Phosphatase 01/18/2018 72  38 - 126 U/L Final  . Total Bilirubin  01/18/2018 0.6  0.3 - 1.2 mg/dL Final  . GFR calc non Af Amer 01/18/2018 >60  >60 mL/min Final  . GFR calc Af Amer 01/18/2018 >60  >60 mL/min Final  . Anion gap 01/18/2018 9  5 - 15 Final   Performed at [facility], [address] [place], [place] [address]  . WBC 01/18/2018 5.0  4.0 - 10.5 K/uL Final  . RBC 01/18/2018 4.40  4.22 - 5.81 MIL/uL Final  . Hemoglobin 01/18/2018 12.1* 13.0 - 17.0 g/dL Final  . HCT 01/18/2018 36.5* 39.0 - 52.0 % Final  . MCV 01/18/2018 83.0  80.0 - 100.0 fL Final  . MCH 01/18/2018 27.5  26.0 - 34.0 pg Final  . MCHC 01/18/2018 33.2  30.0 - 36.0 g/dL Final  . RDW 01/18/2018 12.6  11.5 - 15.5 % Final  . Platelets 01/18/2018 200  150 - 400 K/uL Final  . nRBC 01/18/2018 0.0  0.0 - 0.2 % Final  . Neutrophils Relative % 01/18/2018 61  % Final  . Neutro Abs 01/18/2018 3.0  1.7 - 7.7 K/uL Final  . Lymphocytes Relative 01/18/2018 29  % Final  . Lymphs Abs 01/18/2018 1.4  0.7 - 4.0 K/uL Final  . Monocytes Relative 01/18/2018 6  % Final  . Monocytes Absolute 01/18/2018 0.3  0.1 - 1.0 K/uL Final  . Eosinophils Relative 01/18/2018 3  % Final  . Eosinophils Absolute 01/18/2018 0.2  0.0 - 0.5 K/uL Final  . Basophils Relative 01/18/2018 1  % Final  . Basophils Absolute 01/18/2018 0.0  0.0 - 0.1 K/uL Final  . Immature Granulocytes 01/18/2018 0  % Final  . Abs Immature Granulocytes 01/18/2018 0.02  0.00 - 0.07 K/uL Final   Performed at [facility], [address] [place], [place]  [address]    Assessment:  CHINONSO LINKER is a 48 y.o. male with stage IIA sigmoid colon cancer s/p resection and ostomy on 12/31/2017.  Pathology revealed a 6.3 cm grade II sigmoid adenocarcinoma.  Tumor invaded the muscularis propria into the peri-colorectal tissue.  All margins were negative.  There was no lymphovascular invasion or perineural invasion.  There were no tumor deposits.  Zero of 20 lymph nodes were positive.  Greater omentum excision revealed diffuse acute peritonitis with edema and hemorrhage.  Pathologic stage was T3N0.  MMR/MSI was negative/intact.  He presented with rectal bleeding.  CEA was 2.3 on 01/18/2018.  Colonoscopy on 12/23/2017 revealed fungating and infiltrative partially obstructing large mass 32 cm proximal to the anus.  The mass was 3.5 cm in length and circumferential.  The mass could not be passed with a pediatric scope.  Chest, abdomen and pelvic CT on 12/28/2017 revealed acute inflammatory changes and free fluid associated with the sigmoid colon in the mesentery, centered at the site of the sigmoid colon mass. The differential included complicating features of colonoscopy/biopsy, infectious/inflammatory colitis, or potentially a perforating colon cancer which pre-existed the recent colonoscopy.  There was circumferential thickening of the sigmoid colon, compatible with colon cancer. There were multiple small nodules/lymph nodes of the sigmoid mesentery which extended along the inferior mesenteric neurovascular drainage pathway. These may be pathologic with lymphatic extension versus reactive/inflammatory changes.  Family history is limited.  He has an aunt with colon cancer at age 64.  Symptomatically, he is doing well.  Exam is unremarkable.  Plan: 1.  Review labs from last week. 2.  Stage IIA colon cancer:  Re-review pathology.  Tumor is grade II.  No lymph nodes involved.  Margins are negative.  No gross evidence of perforation, but concern secondary to initial  scans and pathology.    Re-review patient's high risk features for recurrence: concern for localized perforation.   CEA is normal at 2.3.   Discuss sending tumor for Oncotype DX.  Discuss recurrence score and use in determining benefit for chemotherapy in stage II disease.  Review prior discussion of Xeloda x 6 months if recurrence score is high.  Discuss risk versus benefit.  Side effects re-reviewed.  Discuss tumor board discussion.  Agree with Oncotype DX testing.    Discuss genetic testing.  Patient agreeable to [facility] testing. 3.   Normocytic anemia:  Hemoglobin has improved from 9.9 on 01/04/2018 to 12.1 on 01/18/2018.  Ferritin is normal/elevated.  Elevated ferritin likely secondary to an acute phase reactant.  Continue to monitor. 4.  RTC after [facility] test results and Oncotype DX results return for discussion regarding direction of therapy.    Lequita Asal, MD  01/25/2018, 3:35 PM

## 2018-01-25 NOTE — Progress Notes (Signed)
No new changes noted today 

## 2018-01-25 NOTE — Telephone Encounter (Signed)
[employer] Dx forms faxed to 1 [phone] faxed conformation confirmed.

## 2018-01-26 ENCOUNTER — Encounter: Payer: Self-pay | Admitting: *Deleted

## 2018-02-08 ENCOUNTER — Encounter: Payer: Self-pay | Admitting: Hematology and Oncology

## 2018-02-23 ENCOUNTER — Inpatient Hospital Stay: Payer: BLUE CROSS/BLUE SHIELD | Attending: Hematology and Oncology | Admitting: Hematology and Oncology

## 2018-02-23 ENCOUNTER — Encounter: Payer: Self-pay | Admitting: Hematology and Oncology

## 2018-02-23 VITALS — BP 140/85 | HR 87 | Temp 97.9°F | Resp 18 | Ht 68.0 in | Wt 200.1 lb

## 2018-02-23 DIAGNOSIS — I129 Hypertensive chronic kidney disease with stage 1 through stage 4 chronic kidney disease, or unspecified chronic kidney disease: Secondary | ICD-10-CM | POA: Diagnosis not present

## 2018-02-23 DIAGNOSIS — Z933 Colostomy status: Secondary | ICD-10-CM | POA: Diagnosis not present

## 2018-02-23 DIAGNOSIS — D649 Anemia, unspecified: Secondary | ICD-10-CM | POA: Diagnosis not present

## 2018-02-23 DIAGNOSIS — E1122 Type 2 diabetes mellitus with diabetic chronic kidney disease: Secondary | ICD-10-CM | POA: Insufficient documentation

## 2018-02-23 DIAGNOSIS — C187 Malignant neoplasm of sigmoid colon: Secondary | ICD-10-CM | POA: Diagnosis present

## 2018-02-23 DIAGNOSIS — Z85038 Personal history of other malignant neoplasm of large intestine: Secondary | ICD-10-CM | POA: Diagnosis not present

## 2018-02-23 DIAGNOSIS — N182 Chronic kidney disease, stage 2 (mild): Secondary | ICD-10-CM | POA: Insufficient documentation

## 2018-02-23 DIAGNOSIS — Z79899 Other long term (current) drug therapy: Secondary | ICD-10-CM | POA: Insufficient documentation

## 2018-02-23 NOTE — Patient Instructions (Signed)
Capecitabine tablets  What is this medicine?  CAPECITABINE (ka pe SITE a been) is a chemotherapy drug. It slows the growth of cancer cells. This medicine is used to treat breast cancer, and also colon or rectal cancer.  This medicine may be used for other purposes; ask your health care provider or pharmacist if you have questions.  COMMON BRAND NAME(S): Xeloda  What should I tell my health care provider before I take this medicine?  They need to know if you have any of these conditions:  -bleeding disorders  -dehydration  -dihydropyrimidine dehydrogenase (DPD) deficiency  -heart disease  -infection (especially a virus infection such as chickenpox, cold sores, or herpes)  -kidney disease  -liver disease  -low blood counts, like low white cell, platelet, or red cell counts  -an unusual or allergic reaction to capecitabine, fluorouracil, other medicines, foods, dyes, or preservatives  -pregnant or trying to get pregnant  -breast-feeding  How should I use this medicine?  Take this medicine by mouth with a glass of water, within 30 minutes of the end of a meal. Do not cut, crush or chew this medicine. Follow the directions on the prescription label. Take your medicine at regular intervals. Do not take it more often than directed. Do not stop taking except on your doctor's advice.  Your doctor may want you to take a combination of 150 mg and 500 mg tablets for each dose. It is very important that you know how to correctly take your dose. Taking the wrong tablets could result in an overdose (too much medication) or underdose (too little medication).  Talk to your pediatrician regarding the use of this medicine in children. Special care may be needed.  Overdosage: If you think you have taken too much of this medicine contact a poison control center or emergency room at once.  NOTE: This medicine is only for you. Do not share this medicine with others.  What if I miss a dose?  If you miss a dose, do not take the missed  dose at all. Do not take double or extra doses. Instead, continue with your next scheduled dose and check with your doctor.  What may interact with this medicine?  This medicine may interact with the following medications:  -allopurinol  -leucovorin  -phenytoin  -warfarin  This list may not describe all possible interactions. Give your health care provider a list of all the medicines, herbs, non-prescription drugs, or dietary supplements you use. Also tell them if you smoke, drink alcohol, or use illegal drugs. Some items may interact with your medicine.  What should I watch for while using this medicine?  Visit your doctor for checks on your progress. This drug may make you feel generally unwell. This is not uncommon, as chemotherapy can affect healthy cells as well as cancer cells. Report any side effects. Continue your course of treatment even though you feel ill unless your doctor tells you to stop.  In some cases, you may be given additional medicines to help with side effects. Follow all directions for their use.  Call your doctor or health care professional for advice if you get a fever, chills or sore throat, or other symptoms of a cold or flu. Do not treat yourself. This drug decreases your body's ability to fight infections. Try to avoid being around people who are sick.  This medicine may increase your risk to bruise or bleed. Call your doctor or health care professional if you notice any unusual bleeding.    Be careful brushing and flossing your teeth or using a toothpick because you may get an infection or bleed more easily. If you have any dental work done, tell your dentist you are receiving this medicine.  Avoid taking products that contain aspirin, acetaminophen, ibuprofen, naproxen, or ketoprofen unless instructed by your doctor. These medicines may hide a fever.  Do not become pregnant while taking this medicine or for 6 months after stopping it. Women should inform their doctor if they wish to  become pregnant or think they might be pregnant. There is a potential for serious side effects to an unborn child. Talk to your health care professional or pharmacist for more information. Do not breast-feed an infant while taking this medicine or for 2 weeks after stopping it.  Men are advised not to father a child while taking this medicine or for 3 months after stopping it.  This medicine may make it more difficult to get pregnant or father a child. Talk with your doctor or health care professional if you are concerned about your fertility.  What side effects may I notice from receiving this medicine?  Side effects that you should report to your doctor or health care professional as soon as possible:  -allergic reactions like skin rash, itching or hives, swelling of the face, lips, or tongue  -diarrhea  -low blood counts - this medicine may decrease the number of white blood cells, red blood cells and platelets. You may be at increased risk for infections and bleeding  -nausea, vomiting  -redness, blistering, peeling or loosening of the skin, including inside the mouth (this can be added for any serious or exfoliative rash that could lead to hospitalization)  -redness, swelling, or sores on hands or feet  -signs and symptoms of kidney injury like trouble passing urine or change in the amount of urine  -signs and symptoms of liver injury like dark yellow or brown urine; general ill feeling or flu-like symptoms; light-colored stools; loss of appetite; nausea; right upper belly pain; unusually weak or tired; yellowing of the eyes or skin  -signs of decreased platelets or bleeding - bruising, pinpoint red spots on the skin, black, tarry stools, blood in the urine  -signs of decreased red blood cells - unusually weak or tired, fainting spells, lightheadedness  -signs of infection - fever or chills, cough, sore throat, pain or difficulty passing urine  Side effects that usually do not require medical attention (report  to your doctor or health care professional if they continue or are bothersome):  -changes in vision  -constipation  -loss of appetite  -mouth sores  -pain, tingling, numbness in the hands or feet  This list may not describe all possible side effects. Call your doctor for medical advice about side effects. You may report side effects to FDA at 1-800-FDA-1088.  Where should I keep my medicine?  Keep out of the reach of children.  Store at room temperature between 15 and 30 degrees C (59 and 86 degrees F). Keep container tightly closed. Throw away any unused medicine after the expiration date.  NOTE: This sheet is a summary. It may not cover all possible information. If you have questions about this medicine, talk to your doctor, pharmacist, or health care provider.   2019 Elsevier/Gold Standard (2017-04-14 21:22:45)

## 2018-02-23 NOTE — Progress Notes (Signed)
[facility] day:  02/23/2018   Chief Complaint: Adam Farrell is a 48 y.o. male with stage IIA sigmoid colon cancer who is seen for review of interval Oncotype DX and [employer] genetic testing.  HPI:  The patient was seen last seen in the medical oncology clinic on 01/25/2018.  At that time, her was doing well.   Exam was unremarkable.  CEA was 2.3.  We discussed tumor board conversations.  Decision was made to proceed with Oncotype DX testing.  Oncotype DX testing revealed a recurrence score of 15 which translated to a 12% (95% CI 9-17%) chance of recurrence within 3 years for a T3 tumor with surgery alone.  The risk of recurrence within 5 years of adjuvant 5FU/LV is 7%.  The risk of recurrence will likely decrease by 1% with the addition of oxaliplatin (risk 6%).  [employer] genetic testing was negative.  During the interim, patient has done well.  He is back to work.  He does not make note of any acute or concerning symptoms. He feels generally well. Patient denies that he has experienced any B symptoms. He denies any interval infections. Patient denies bleeding; no hematochezia, melena, or gross hematuria. He is not experiencing any abdominal pain.   Scheduled to follow up with surgery [name], MD) towards the end of 03/2018. Plans, at present, are for colostomy revision in 06/2018 or 07/2018.  Patient advises that he maintains an adequate appetite. He is eating well. Weight today is 200 lb 1.1 oz (90.8 kg), which compared to his last visit to the clinic, represents an 11 pound increase.     Patient denies pain in the clinic today.   Past Medical History:  Diagnosis Date  . CKD (chronic kidney disease), stage II   . Colon cancer ([place])   . Diabetes mellitus without complication ([place])   . Hypertension     Past Surgical History:  Procedure Laterality Date  . COLON RESECTION SIGMOID N/A 12/31/2017   Procedure: COLON RESECTION SIGMOID;  Surgeon:  Vickie Epley, MD;  Location: [facility] ORS;  Service: General;  Laterality: N/A;  . COLOSTOMY N/A 12/31/2017   Procedure: COLOSTOMY;  Surgeon: Vickie Epley, MD;  Location: [facility] ORS;  Service: General;  Laterality: N/A;    Family History  Adopted: Yes  Problem Relation Age of Onset  . Colon cancer Paternal Aunt     Social History:  reports that he has never smoked. He has never used smokeless tobacco. He reports current alcohol use. No history on file for drug.  Patient drinks beer, sometimes daily. No tobacco use. Patient denies known exposures to radiation on toxins. 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  He has a 49 year old daughter.  The patient is accompanied by his wife, [name], today.  Allergies: No Known Allergies  Current Medications: Current Outpatient Medications  Medication Sig Dispense Refill  . cetirizine (ZYRTEC) 10 MG tablet Take by mouth.    Marland Kitchen lisinopril (PRINIVIL,ZESTRIL) 20 MG tablet Take 20 mg by mouth daily.    . metFORMIN (GLUCOPHAGE-XR) 500 MG 24 hr tablet Take by mouth.    . meloxicam (MOBIC) 15 MG tablet Take 1 tablet (15 mg total) by mouth daily. (Patient not taking: Reported on 12/28/2017) 30 tablet 0  . oxyCODONE-acetaminophen (PERCOCET/ROXICET) 5-325 MG tablet Take 1 tablet by mouth every 4 (four) hours as needed for severe pain. (  Patient not taking: Reported on 01/18/2018) 30 tablet 0   No current facility-administered medications for this visit.     Review of Systems  Constitutional: Negative for diaphoresis, fever, malaise/fatigue and weight loss (up 11 pounds).       Feels well.  Working FT.  HENT: Negative.  Negative for congestion, ear pain, nosebleeds, sinus pain and sore throat.   Eyes: Negative.  Negative for blurred vision, double vision and photophobia.  Respiratory: Negative.  Negative for cough, hemoptysis, sputum production and shortness of breath.   Cardiovascular:  Negative.  Negative for chest pain, palpitations, orthopnea, leg swelling and PND.  Gastrointestinal: Negative for abdominal pain, blood in stool, constipation, diarrhea, melena, nausea and vomiting.       Colostomy.  Genitourinary: Negative.  Negative for dysuria, frequency, hematuria and urgency.  Musculoskeletal: Negative.  Negative for back pain, falls, joint pain and myalgias.  Skin: Negative for itching and rash.  Neurological: Negative.  Negative for dizziness, tremors, sensory change, speech change, focal weakness, weakness and headaches.  Endo/Heme/Allergies: Negative.  Does not bruise/bleed easily.  Psychiatric/Behavioral: Negative.  Negative for depression and memory loss. The patient is not nervous/anxious and does not have insomnia.   All other systems reviewed and are negative.  Performance status (ECOG): 0  Vital Signs BP 140/85   Pulse 87   Temp 97.9 F (36.6 C) (Tympanic)   Resp 18   Ht 5' 8" (1.727 m)   Wt 200 lb 1.1 oz (90.8 kg)   SpO2 100%   BMI 30.42 kg/m   Physical Exam  Constitutional: He is oriented to person, place, and time and well-developed, well-nourished, and in no distress. No distress.  HENT:  Head: Normocephalic and atraumatic.  Mouth/Throat: Oropharynx is clear and moist and mucous membranes are normal. No oropharyngeal exudate.  Gray hair and beard.  Eyes: Pupils are equal, round, and reactive to light. Conjunctivae and EOM are normal. No scleral icterus.  Neck: Normal range of motion. Neck supple. No JVD present.  Cardiovascular: Normal rate, regular rhythm, normal heart sounds and intact distal pulses. Exam reveals no gallop and no friction rub.  No murmur heard. Pulmonary/Chest: Effort normal and breath sounds normal. No respiratory distress. He has no wheezes. He has no rales.  Abdominal: Soft. Bowel sounds are normal. He exhibits no distension and no mass. There is no abdominal tenderness. There is no rebound and no guarding.  Colostomy;  brown liquid stool.   Musculoskeletal: Normal range of motion.        General: No tenderness or edema.  Lymphadenopathy:    He has no cervical adenopathy.  Neurological: He is alert and oriented to person, place, and time. Gait normal.  Skin: Skin is warm and dry. No rash noted. He is not diaphoretic. No erythema. No pallor.  Psychiatric: Mood, affect and judgment normal.  Nursing note and vitals reviewed.   No visits with results within 3 Day(s) from this visit.  Latest known visit with results is:  Appointment on 01/18/2018  Component Date Value Ref Range Status  . Iron 01/18/2018 58  45 - 182 ug/dL Final  . TIBC 01/18/2018 286  250 - 450 ug/dL Final  . Saturation Ratios 01/18/2018 20  17.9 - 39.5 % Final  . UIBC 01/18/2018 228  ug/dL Final   Performed at [facility], [address] [place], [place] [address]  . Ferritin 01/18/2018 566* 24 - 336 ng/mL Final   Performed at [facility], [address]   [place], [place] [address]  . CEA 01/18/2018 2.3  0.0 - 4.7 ng/mL Final   Comment: (NOTE)                             Nonsmokers          <3.9                             Smokers             <5.6 Roche [employer]ostics Electrochemiluminescence Immunoassay (ECLIA) Values obtained with different assay methods or kits cannot be used interchangeably.  Results cannot be interpreted as absolute evidence of the presence or absence of malignant disease. Performed At: [facility] [place], [place] [id number] Rush Farmer MD [id number]   . Sodium 01/18/2018 140  135 - 145 mmol/L Final  . Potassium 01/18/2018 3.8  3.5 - 5.1 mmol/L Final  . Chloride 01/18/2018 104  98 - 111 mmol/L Final  . CO2 01/18/2018 27  22 - 32 mmol/L Final  . Glucose, Bld 01/18/2018 147* 70 - 99 mg/dL Final  . BUN 01/18/2018 13  6 - 20 mg/dL Final  . Creatinine, Ser 01/18/2018 1.09  0.61 - 1.24 mg/dL Final  . Calcium 01/18/2018 8.9  8.9 - 10.3 mg/dL Final  . Total  Protein 01/18/2018 8.2* 6.5 - 8.1 g/dL Final  . Albumin 01/18/2018 3.6  3.5 - 5.0 g/dL Final  . AST 01/18/2018 21  15 - 41 U/L Final  . ALT 01/18/2018 21  0 - 44 U/L Final  . Alkaline Phosphatase 01/18/2018 72  38 - 126 U/L Final  . Total Bilirubin 01/18/2018 0.6  0.3 - 1.2 mg/dL Final  . GFR calc non Af Amer 01/18/2018 >60  >60 mL/min Final  . GFR calc Af Amer 01/18/2018 >60  >60 mL/min Final  . Anion gap 01/18/2018 9  5 - 15 Final   Performed at [facility], [address], [place], [place] [address]  . WBC 01/18/2018 5.0  4.0 - 10.5 K/uL Final  . RBC 01/18/2018 4.40  4.22 - 5.81 MIL/uL Final  . Hemoglobin 01/18/2018 12.1* 13.0 - 17.0 g/dL Final  . HCT 01/18/2018 36.5* 39.0 - 52.0 % Final  . MCV 01/18/2018 83.0  80.0 - 100.0 fL Final  . MCH 01/18/2018 27.5  26.0 - 34.0 pg Final  . MCHC 01/18/2018 33.2  30.0 - 36.0 g/dL Final  . RDW 01/18/2018 12.6  11.5 - 15.5 % Final  . Platelets 01/18/2018 200  150 - 400 K/uL Final  . nRBC 01/18/2018 0.0  0.0 - 0.2 % Final  . Neutrophils Relative % 01/18/2018 61  % Final  . Neutro Abs 01/18/2018 3.0  1.7 - 7.7 K/uL Final  . Lymphocytes Relative 01/18/2018 29  % Final  . Lymphs Abs 01/18/2018 1.4  0.7 - 4.0 K/uL Final  . Monocytes Relative 01/18/2018 6  % Final  . Monocytes Absolute 01/18/2018 0.3  0.1 - 1.0 K/uL Final  . Eosinophils Relative 01/18/2018 3  % Final  . Eosinophils Absolute 01/18/2018 0.2  0.0 - 0.5 K/uL Final  . Basophils Relative 01/18/2018 1  % Final  . Basophils Absolute 01/18/2018 0.0  0.0 - 0.1 K/uL Final  . Immature Granulocytes 01/18/2018 0  % Final  . Abs Immature Granulocytes 01/18/2018 0.02  0.00 - 0.07 K/uL Final   Performed at [facility], [address] [place],  [address]    Assessment:  Kostas S Bracy is a 47 y.o. male with stage IIA sigmoid colon cancer s/p resection and ostomy on 12/31/2017.  Pathology revealed a 6.3 cm grade II sigmoid adenocarcinoma.  Tumor invaded the  muscularis propria into the peri-colorectal tissue.  All margins were negative.  There was no lymphovascular invasion or perineural invasion.  There were no tumor deposits.  Zero of 20 lymph nodes were positive.  Greater omentum excision revealed diffuse acute peritonitis with edema and hemorrhage.  Pathologic stage was T3N0.  MMR/MSI was negative/intact.  He presented with rectal bleeding.  CEA was 2.3 on 01/18/2018.  Oncotype DX testing revealed a recurrence score of 15 which translated to a 12% (95% CI 9-17%) chance of recurrence within 3 years for a T3 tumor with surgery alone.  The risk of recurrence within 5 years of adjuvant 5FU/LV is 7%.  The risk of recurrence will likely decrease by 1% with the addition of oxaliplatin (risk 6%).  Colonoscopy on 12/23/2017 revealed fungating and infiltrative partially obstructing large mass 32 cm proximal to the anus.  The mass was 3.5 cm in length and circumferential.  The mass could not be passed with a pediatric scope.  Chest, abdomen and pelvic CT on 12/28/2017 revealed acute inflammatory changes and free fluid associated with the sigmoid colon in the mesentery, centered at the site of the sigmoid colon mass. The differential included complicating features of colonoscopy/biopsy, infectious/inflammatory colitis, or potentially a perforating colon cancer which pre-existed the recent colonoscopy.  There was circumferential thickening of the sigmoid colon, compatible with colon cancer. There were multiple small nodules/lymph nodes of the sigmoid mesentery which extended along the inferior mesenteric neurovascular drainage pathway. These may be pathologic with lymphatic extension versus reactive/inflammatory changes.  Family history is limited.  He has an aunt with colon cancer at age 68.  [employer]ae genetic testing was negative.  Symptomatically, patient is doing well. He denies any acute concerns.  Patient denies bleeding; no hematochezia, melena, or gross  hematuria. No abdominal pain. Appetite stable; weight up 11 pounds. Exam is unremarkable.   Plan: 1. Stage IIa colon cancer  Discuss scheduled follow-up with GI/surgery  Plans are for annual surveillance colonoscopic examinations.  Follow up with surgery (Davis, MD) on 04/13/2018.  Review Oncotype Dx results demonstrating a recurrence score of 15  3-year recurrence, with surgery alone, 12% for T3 tumor, or 21% for T4 tumor.  Risk of recurrence within 5 years of adjuvant 5FU + LV is 7%, decreasing by another 1% with the addition of oxaliplatin.   Review and genetic testing results  No genetic mutations found.  Discussed treatment options using systemic 5FU + LV versus oral Xeloda.  Benefits vs. risks reviewed.   Side effects discussed.   Patient provided with printed information on Xeloda today on his AVS for review at home. Patient encouraged to make a list of questions and/or concerns for further discussion prior to beginning therapy using this medication.   Patient to call the clinic with a decision as to whether or not he is going to allow treatment.  Undecided at this time.  Patient notes trouble taking pills in general.  Patient notes issues with additional equipment (infusion pump). 2. Normocytic anemia  No new labs today.  CBC improving with last check.  Continue routine lab monitoring. 3. RTC in 3 months for MD assessment and labs (CBC with differential, CMP, CEA).   Bryan Gray, NP  02/23/2018,   9:01 AM   I saw and evaluated the patient, participating in the key portions of the service and reviewing pertinent diagnostic studies and records.  I reviewed the nurse practitioner's note and agree with the findings and the plan.  The assessment and plan were discussed with the patient.  Multiple questions were asked by the patient and answered.   Melissa Corcoran, MD 02/23/2018,9:01 AM   

## 2018-02-23 NOTE — Progress Notes (Signed)
No new changes noted today 

## 2018-02-25 ENCOUNTER — Other Ambulatory Visit: Payer: Self-pay | Admitting: Urgent Care

## 2018-02-25 ENCOUNTER — Other Ambulatory Visit: Payer: Self-pay | Admitting: Hematology and Oncology

## 2018-02-25 DIAGNOSIS — C187 Malignant neoplasm of sigmoid colon: Secondary | ICD-10-CM

## 2018-02-25 MED ORDER — CAPECITABINE 500 MG PO TABS: 1000.0000 mg/m2 | ORAL_TABLET | Freq: Two times a day (BID) | ORAL | 0 refills | Status: DC

## 2018-02-25 NOTE — Progress Notes (Signed)
START ON PATHWAY REGIMEN - Colorectal     A cycle is every 21 days:     Capecitabine   **Always confirm dose/schedule in your pharmacy ordering system**  Patient Characteristics: Postoperative without Neoadjuvant Therapy (Pathologic Staging), Colon, Stage IIA, Clinical High Risk Therapeutic Status: Postoperative without Neoadjuvant Therapy (Pathologic Staging) Tumor Location: Colon AJCC M Category: cM0 AJCC T Category: pT3 AJCC N Category: pN0 AJCC 8 Stage Grouping: IIA Intent of Therapy: Curative Intent, Discussed with Patient

## 2018-02-26 ENCOUNTER — Telehealth: Payer: Self-pay | Admitting: Pharmacy Technician

## 2018-02-26 ENCOUNTER — Telehealth: Payer: Self-pay | Admitting: Pharmacist

## 2018-02-26 DIAGNOSIS — C187 Malignant neoplasm of sigmoid colon: Secondary | ICD-10-CM

## 2018-02-26 MED ORDER — CAPECITABINE 500 MG PO TABS: 1000.0000 mg/m2 | ORAL_TABLET | Freq: Two times a day (BID) | ORAL | 0 refills | Status: DC

## 2018-02-26 NOTE — Telephone Encounter (Signed)
Oral Oncology Patient Advocate Encounter  Received notification from [facility] that prior authorization for Xeloda is required.  PA submitted on CoverMyMeds Key [id number] Status is pending  Oral Oncology Clinic will continue to follow.  [place] Patient [place] Phone [phone] Fax [phone] 02/26/2018 10:49 AM

## 2018-02-26 NOTE — Telephone Encounter (Signed)
Oral Oncology Pharmacist Encounter  Received new prescription for Xeloda (capecitabine) for the treatment of stage IIA sigmoid colon cancer, planned duration until disease progression or unacceptable drug toxicity.  CMP from 01/18/2018 assessed, no relevant lab abnormalities. Prescription dose and frequency assessed.   Current medication list in Epic reviewed, no DDIs with capecitabine identified.   Prescription has been e-scribed to the [facility] for benefits analysis and approval.  Oral Oncology Clinic will continue to follow for insurance authorization, copayment issues, initial counseling and start date.  Darl Pikes, PharmD, BCPS, [facility] Hematology/Oncology Clinical Pharmacist [facility]/AP [facility] [phone]  02/26/2018 8:38 AM

## 2018-03-01 MED ORDER — CAPECITABINE 500 MG PO TABS: 1000.0000 mg/m2 | ORAL_TABLET | Freq: Two times a day (BID) | ORAL | 0 refills | Status: DC

## 2018-03-01 NOTE — Telephone Encounter (Signed)
Oral Oncology Patient Advocate Encounter  Prior Authorization for Xeloda has been approved.    PA# [id number] Effective dates: 02/28/2018 through 03/01/2019  Patient must fill rx through [facility].    Oral Oncology Clinic will continue to follow.   [place] Patient [place] Phone [phone] Fax [phone] 03/01/2018 12:07 PM

## 2018-03-01 NOTE — Telephone Encounter (Signed)
Oral Chemotherapy Pharmacist Encounter  Due to insurance restriction the medication could not be filled at [facility]. Prescription has been e-scribed to [facility].  Supportive information was faxed to [facility]. We will continue to follow medication access.   Darl Pikes, PharmD, BCPS, [facility] Hematology/Oncology Clinical Pharmacist [facility] [phone]  03/01/2018 1:28 PM

## 2018-03-03 NOTE — Telephone Encounter (Signed)
Copay is $178.07 per [facility].  They have tried calling patient 2 times to set up delivery.

## 2018-03-04 NOTE — Telephone Encounter (Signed)
Patient has scheduled delivery for 2/21.  Patient being billed copay.  He will contact us if he has issues paying his copay.  We will continue to watch for grant funding to open.

## 2018-03-05 ENCOUNTER — Encounter: Payer: Self-pay | Admitting: Hematology and Oncology

## 2018-03-05 ENCOUNTER — Other Ambulatory Visit: Payer: Self-pay

## 2018-03-05 ENCOUNTER — Inpatient Hospital Stay: Payer: BLUE CROSS/BLUE SHIELD

## 2018-03-05 ENCOUNTER — Inpatient Hospital Stay: Payer: BLUE CROSS/BLUE SHIELD | Admitting: Hematology and Oncology

## 2018-03-05 VITALS — BP 129/83 | HR 84 | Temp 97.2°F | Resp 18 | Ht 68.0 in | Wt 200.3 lb

## 2018-03-05 DIAGNOSIS — Z933 Colostomy status: Secondary | ICD-10-CM | POA: Diagnosis not present

## 2018-03-05 DIAGNOSIS — C187 Malignant neoplasm of sigmoid colon: Secondary | ICD-10-CM

## 2018-03-05 LAB — CBC WITH DIFFERENTIAL/PLATELET
Abs Immature Granulocytes: 0.01 10*3/uL (ref 0.00–0.07)
Basophils Absolute: 0.1 10*3/uL (ref 0.0–0.1)
Basophils Relative: 1 %
Eosinophils Absolute: 0.2 10*3/uL (ref 0.0–0.5)
Eosinophils Relative: 3 %
HCT: 43.1 % (ref 39.0–52.0)
Hemoglobin: 14.7 g/dL (ref 13.0–17.0)
Immature Granulocytes: 0 %
Lymphocytes Relative: 34 %
Lymphs Abs: 1.8 10*3/uL (ref 0.7–4.0)
MCH: 28.1 pg (ref 26.0–34.0)
MCHC: 34.1 g/dL (ref 30.0–36.0)
MCV: 82.3 fL (ref 80.0–100.0)
Monocytes Absolute: 0.3 10*3/uL (ref 0.1–1.0)
Monocytes Relative: 6 %
Neutro Abs: 2.9 10*3/uL (ref 1.7–7.7)
Neutrophils Relative %: 56 %
Platelets: 167 10*3/uL (ref 150–400)
RBC: 5.24 MIL/uL (ref 4.22–5.81)
RDW: 13.4 % (ref 11.5–15.5)
WBC: 5.3 10*3/uL (ref 4.0–10.5)
nRBC: 0 % (ref 0.0–0.2)

## 2018-03-05 LAB — COMPREHENSIVE METABOLIC PANEL
ALT: 22 U/L (ref 0–44)
AST: 20 U/L (ref 15–41)
Albumin: 4.4 g/dL (ref 3.5–5.0)
Alkaline Phosphatase: 79 U/L (ref 38–126)
Anion gap: 8 (ref 5–15)
BUN: 13 mg/dL (ref 6–20)
CO2: 27 mmol/L (ref 22–32)
Calcium: 9.2 mg/dL (ref 8.9–10.3)
Chloride: 101 mmol/L (ref 98–111)
Creatinine, Ser: 0.93 mg/dL (ref 0.61–1.24)
GFR calc Af Amer: >60 mL/min (ref >60)
GFR calc non Af Amer: >60 mL/min (ref >60)
Glucose, Bld: 177 mg/dL — ABNORMAL HIGH (ref 70–99)
Potassium: 4 mmol/L (ref 3.5–5.1)
Sodium: 136 mmol/L (ref 135–145)
Total Bilirubin: 0.8 mg/dL (ref 0.3–1.2)
Total Protein: 8.7 g/dL — ABNORMAL HIGH (ref 6.5–8.1)

## 2018-03-05 MED ORDER — ONDANSETRON 4 MG PO TBDP: 4.0000 mg | ORAL_TABLET | Freq: Three times a day (TID) | ORAL | 1 refills | Status: DC | PRN

## 2018-03-05 NOTE — Progress Notes (Signed)
No new changes noted today 

## 2018-03-05 NOTE — Patient Instructions (Signed)
Capecitabine tablets  What is this medicine?  CAPECITABINE (ka pe SITE a been) is a chemotherapy drug. It slows the growth of cancer cells. This medicine is used to treat breast cancer, and also colon or rectal cancer.  This medicine may be used for other purposes; ask your health care provider or pharmacist if you have questions.  COMMON BRAND NAME(S): Xeloda  What should I tell my health care provider before I take this medicine?  They need to know if you have any of these conditions:  -bleeding disorders  -dehydration  -dihydropyrimidine dehydrogenase (DPD) deficiency  -heart disease  -infection (especially a virus infection such as chickenpox, cold sores, or herpes)  -kidney disease  -liver disease  -low blood counts, like low white cell, platelet, or red cell counts  -an unusual or allergic reaction to capecitabine, fluorouracil, other medicines, foods, dyes, or preservatives  -pregnant or trying to get pregnant  -breast-feeding  How should I use this medicine?  Take this medicine by mouth with a glass of water, within 30 minutes of the end of a meal. Do not cut, crush or chew this medicine. Follow the directions on the prescription label. Take your medicine at regular intervals. Do not take it more often than directed. Do not stop taking except on your doctor's advice.  Your doctor may want you to take a combination of 150 mg and 500 mg tablets for each dose. It is very important that you know how to correctly take your dose. Taking the wrong tablets could result in an overdose (too much medication) or underdose (too little medication).  Talk to your pediatrician regarding the use of this medicine in children. Special care may be needed.  Overdosage: If you think you have taken too much of this medicine contact a poison control center or emergency room at once.  NOTE: This medicine is only for you. Do not share this medicine with others.  What if I miss a dose?  If you miss a dose, do not take the missed  dose at all. Do not take double or extra doses. Instead, continue with your next scheduled dose and check with your doctor.  What may interact with this medicine?  This medicine may interact with the following medications:  -allopurinol  -leucovorin  -phenytoin  -warfarin  This list may not describe all possible interactions. Give your health care provider a list of all the medicines, herbs, non-prescription drugs, or dietary supplements you use. Also tell them if you smoke, drink alcohol, or use illegal drugs. Some items may interact with your medicine.  What should I watch for while using this medicine?  Visit your doctor for checks on your progress. This drug may make you feel generally unwell. This is not uncommon, as chemotherapy can affect healthy cells as well as cancer cells. Report any side effects. Continue your course of treatment even though you feel ill unless your doctor tells you to stop.  In some cases, you may be given additional medicines to help with side effects. Follow all directions for their use.  Call your doctor or health care professional for advice if you get a fever, chills or sore throat, or other symptoms of a cold or flu. Do not treat yourself. This drug decreases your body's ability to fight infections. Try to avoid being around people who are sick.  This medicine may increase your risk to bruise or bleed. Call your doctor or health care professional if you notice any unusual bleeding.    Be careful brushing and flossing your teeth or using a toothpick because you may get an infection or bleed more easily. If you have any dental work done, tell your dentist you are receiving this medicine.  Avoid taking products that contain aspirin, acetaminophen, ibuprofen, naproxen, or ketoprofen unless instructed by your doctor. These medicines may hide a fever.  Do not become pregnant while taking this medicine or for 6 months after stopping it. Women should inform their doctor if they wish to  become pregnant or think they might be pregnant. There is a potential for serious side effects to an unborn child. Talk to your health care professional or pharmacist for more information. Do not breast-feed an infant while taking this medicine or for 2 weeks after stopping it.  Men are advised not to father a child while taking this medicine or for 3 months after stopping it.  This medicine may make it more difficult to get pregnant or father a child. Talk with your doctor or health care professional if you are concerned about your fertility.  What side effects may I notice from receiving this medicine?  Side effects that you should report to your doctor or health care professional as soon as possible:  -allergic reactions like skin rash, itching or hives, swelling of the face, lips, or tongue  -diarrhea  -low blood counts - this medicine may decrease the number of white blood cells, red blood cells and platelets. You may be at increased risk for infections and bleeding  -nausea, vomiting  -redness, blistering, peeling or loosening of the skin, including inside the mouth (this can be added for any serious or exfoliative rash that could lead to hospitalization)  -redness, swelling, or sores on hands or feet  -signs and symptoms of kidney injury like trouble passing urine or change in the amount of urine  -signs and symptoms of liver injury like dark yellow or brown urine; general ill feeling or flu-like symptoms; light-colored stools; loss of appetite; nausea; right upper belly pain; unusually weak or tired; yellowing of the eyes or skin  -signs of decreased platelets or bleeding - bruising, pinpoint red spots on the skin, black, tarry stools, blood in the urine  -signs of decreased red blood cells - unusually weak or tired, fainting spells, lightheadedness  -signs of infection - fever or chills, cough, sore throat, pain or difficulty passing urine  Side effects that usually do not require medical attention (report  to your doctor or health care professional if they continue or are bothersome):  -changes in vision  -constipation  -loss of appetite  -mouth sores  -pain, tingling, numbness in the hands or feet  This list may not describe all possible side effects. Call your doctor for medical advice about side effects. You may report side effects to FDA at 1-800-FDA-1088.  Where should I keep my medicine?  Keep out of the reach of children.  Store at room temperature between 15 and 30 degrees C (59 and 86 degrees F). Keep container tightly closed. Throw away any unused medicine after the expiration date.  NOTE: This sheet is a summary. It may not cover all possible information. If you have questions about this medicine, talk to your doctor, pharmacist, or health care provider.   2019 Elsevier/Gold Standard (2017-04-14 21:22:45)

## 2018-03-05 NOTE — Progress Notes (Signed)
[facility] day:  03/05/2018   Chief Complaint: Adam Farrell is a 48 y.o. male with stage IIA sigmoid colon cancer who is seen for assessment prior to cycle #1 Xeloda.  HPI:  The patient was seen last seen in the medical oncology clinic on 02/23/2018.  At that time, he was doing well. He denied any acute concerns.  He denied bleeding; no hematochezia, melena, or gross hematuria. No abdominal pain. Appetite was stable; weight was up 11 pounds. Exam was unremarkable. We discussed results from Oncotype DX testing and [employer] genetic testing.  He was considering adjuvant Xeloda.  During the interim, patient is doing well today. He does not make note of any acute or concerning symptoms. He feels generally well. Patient denies that he has experienced any B symptoms. He denies any interval infections.  Patient has been reluctant to start oral chemotherapy (Xeloda). Patient states, "Mine and your definition of low dose is very different".   Patient advises that he maintains an adequate appetite. He is eating well. Weight today is 200 lb 4.6 oz (90.9 kg), which compared to his last visit to the clinic, represents a stable weight.  Patient denies pain in the clinic today.   Past Medical History:  Diagnosis Date  . CKD (chronic kidney disease), stage II   . Colon cancer ([name])   . Diabetes mellitus without complication ([place])   . Hypertension     Past Surgical History:  Procedure Laterality Date  . COLON RESECTION SIGMOID N/A 12/31/2017   Procedure: COLON RESECTION SIGMOID;  Surgeon: Vickie Epley, MD;  Location: [facility] ORS;  Service: General;  Laterality: N/A;  . COLOSTOMY N/A 12/31/2017   Procedure: COLOSTOMY;  Surgeon: Vickie Epley, MD;  Location: [facility] ORS;  Service: General;  Laterality: N/A;    Family History  Adopted: Yes  Problem Relation Age of Onset  . Colon cancer Paternal Aunt     Social History:  reports that he has never  smoked. He has never used smokeless tobacco. He reports current alcohol use. No history on file for drug.  Patient drinks beer, sometimes daily. No tobacco use. Patient denies known exposures to radiation on toxins. 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  He has a 90 year old daughter.  The patient is accompanied by his wife, [name], today.  Allergies: No Known Allergies  Current Medications: Current Outpatient Medications  Medication Sig Dispense Refill  . capecitabine (XELODA) 500 MG tablet Take 4 tablets (2,000 mg total) by mouth 2 (two) times daily after a meal for 14 days. Take on days 1-14. Repeat every 21 days. 112 tablet 0  . cetirizine (ZYRTEC) 10 MG tablet Take by mouth.    Marland Kitchen lisinopril (PRINIVIL,ZESTRIL) 20 MG tablet Take 20 mg by mouth daily.    . metFORMIN (GLUCOPHAGE-XR) 500 MG 24 hr tablet Take by mouth.    . meloxicam (MOBIC) 15 MG tablet Take 1 tablet (15 mg total) by mouth daily. (Patient not taking: Reported on 12/28/2017) 30 tablet 0  . oxyCODONE-acetaminophen (PERCOCET/ROXICET) 5-325 MG tablet Take 1 tablet by mouth every 4 (four) hours as needed for severe pain. (Patient not taking: Reported on 01/18/2018) 30 tablet 0   No current facility-administered medications for this visit.     Review of Systems  Constitutional: Negative.  Negative for chills, fever, malaise/fatigue and weight loss (stable).  Feels fine.  HENT: Negative.  Negative for congestion, ear discharge, ear pain, nosebleeds, sinus pain, sore throat and tinnitus.   Eyes: Negative.  Negative for blurred vision, double vision, photophobia and pain.  Respiratory: Negative.  Negative for cough, hemoptysis, sputum production and shortness of breath.   Cardiovascular: Negative.  Negative for chest pain, palpitations, orthopnea, leg swelling and PND.  Gastrointestinal: Negative for abdominal pain, blood in stool, constipation, diarrhea, heartburn,  melena, nausea and vomiting.       Colostomy.  Genitourinary: Negative.  Negative for dysuria, frequency, hematuria and urgency.  Musculoskeletal: Negative.  Negative for back pain, falls, joint pain, myalgias and neck pain.  Skin: Negative.  Negative for itching and rash.  Neurological: Negative.  Negative for dizziness, tremors, sensory change, speech change, focal weakness, weakness and headaches.  Endo/Heme/Allergies: Negative.  Does not bruise/bleed easily.  Psychiatric/Behavioral: Negative.  Negative for depression and memory loss. The patient is not nervous/anxious and does not have insomnia.   All other systems reviewed and are negative.  Performance status (ECOG): 0  Vital Signs BP 129/83 (BP Location: Right Arm, Patient Position: Sitting)   Pulse 84   Temp (!) 97.2 F (36.2 C) (Tympanic)   Resp 18   Ht _0  (1.727 m)   Wt 200 lb 4.6 oz (90.9 kg)   SpO2 100%   BMI 30.45 kg/m   Physical Exam  Constitutional: He is oriented to person, place, and time and well-developed, well-nourished, and in no distress. No distress.  HENT:  Head: Normocephalic and atraumatic.  Mouth/Throat: Oropharynx is clear and moist and mucous membranes are normal. No oropharyngeal exudate.  Wearing a camo cap.  Graying hair and beard.  Eyes: Pupils are equal, round, and reactive to light. Conjunctivae and EOM are normal. No scleral icterus.  Blue eyes.  Neck: Normal range of motion. Neck supple. No JVD present.  Cardiovascular: Normal rate, regular rhythm, normal heart sounds and intact distal pulses. Exam reveals no gallop and no friction rub.  No murmur heard. Pulmonary/Chest: Effort normal and breath sounds normal. No respiratory distress. He has no wheezes. He has no rales.  Abdominal: Soft. Bowel sounds are normal. He exhibits no distension and no mass. There is no abdominal tenderness. There is no rebound and no guarding.  Colostomy; brown liquid stool.   Musculoskeletal: Normal range of  motion.        General: No tenderness or edema.  Lymphadenopathy:    He has no cervical adenopathy.  Neurological: He is alert and oriented to person, place, and time. Gait normal.  Skin: Skin is warm and dry. No rash noted. He is not diaphoretic. No erythema. No pallor.  Psychiatric: Mood, affect and judgment normal.  Nursing note and vitals reviewed.   Appointment on 03/05/2018  Component Date Value Ref Range Status  . WBC 03/05/2018 5.3  4.0 - 10.5 K/uL Final  . RBC 03/05/2018 5.24  4.22 - 5.81 MIL/uL Final  . Hemoglobin 03/05/2018 14.7  13.0 - 17.0 g/dL Final  . HCT 03/05/2018 43.1  39.0 - 52.0 % Final  . MCV 03/05/2018 82.3  80.0 - 100.0 fL Final  . MCH 03/05/2018 28.1  26.0 - 34.0 pg Final  . MCHC 03/05/2018 34.1  30.0 - 36.0 g/dL Final  . RDW 03/05/2018 13.4  11.5 - 15.5 % Final  . Platelets 03/05/2018 167  150 - 400 K/uL Final  . nRBC 03/05/2018 0.0  0.0 - 0.2 % Final  . Neutrophils Relative % 03/05/2018 56  %  Final  . Neutro Abs 03/05/2018 2.9  1.7 - 7.7 K/uL Final  . Lymphocytes Relative 03/05/2018 34  % Final  . Lymphs Abs 03/05/2018 1.8  0.7 - 4.0 K/uL Final  . Monocytes Relative 03/05/2018 6  % Final  . Monocytes Absolute 03/05/2018 0.3  0.1 - 1.0 K/uL Final  . Eosinophils Relative 03/05/2018 3  % Final  . Eosinophils Absolute 03/05/2018 0.2  0.0 - 0.5 K/uL Final  . Basophils Relative 03/05/2018 1  % Final  . Basophils Absolute 03/05/2018 0.1  0.0 - 0.1 K/uL Final  . Immature Granulocytes 03/05/2018 0  % Final  . Abs Immature Granulocytes 03/05/2018 0.01  0.00 - 0.07 K/uL Final   Performed at [facility], [address], [place], [place] [address]    Assessment:  Adam Farrell is a 48 y.o. male with stage IIA sigmoid colon cancer s/p resection and ostomy on 12/31/2017.  Pathology revealed a 6.3 cm grade II sigmoid adenocarcinoma.  Tumor invaded the muscularis propria into the peri-colorectal tissue.  All margins were negative.  There was no  lymphovascular invasion or perineural invasion.  There were no tumor deposits.  Zero of 20 lymph nodes were positive.  Greater omentum excision revealed diffuse acute peritonitis with edema and hemorrhage.  Pathologic stage was T3N0.  MMR/MSI was negative/intact.  He presented with rectal bleeding.  CEA was 2.3 on 01/18/2018.  Oncotype DX testing revealed a recurrence score of 15 which translated to a 12% (95% CI 9-17%) chance of recurrence within 3 years for a T3 tumor with surgery alone.  The risk of recurrence within 5 years of adjuvant 5FU/LV is 7%.  The risk of recurrence will likely decrease by 1% with the addition of oxaliplatin (risk 6%).  Colonoscopy on 12/23/2017 revealed fungating and infiltrative partially obstructing large mass 32 cm proximal to the anus.  The mass was 3.5 cm in length and circumferential.  The mass could not be passed with a pediatric scope.  Chest, abdomen and pelvic CT on 12/28/2017 revealed acute inflammatory changes and free fluid associated with the sigmoid colon in the mesentery, centered at the site of the sigmoid colon mass. The differential included complicating features of colonoscopy/biopsy, infectious/inflammatory colitis, or potentially a perforating colon cancer which pre-existed the recent colonoscopy.  There was circumferential thickening of the sigmoid colon, compatible with colon cancer. There were multiple small nodules/lymph nodes of the sigmoid mesentery which extended along the inferior mesenteric neurovascular drainage pathway. These may be pathologic with lymphatic extension versus reactive/inflammatory changes.  Family history is limited.  He has an aunt with colon cancer at age 7.  [employer] genetic testing was negative.  Symptomatically, he feels well.  Exam is normal.  Plan: 1.   Labs today:  CBC with diff, CMP, CEA. 2.  Stage IIA colon cancer  Discuss patient's decision to pursue adjuvant Xeloda.  Potential side effects reviewed.  Discuss  potential nausea, vomiting, diarrhea, mouth sores, low blood counts, and hand foot syndrome.  Information provided.  Discuss Xeloda 2 weeks on and 1 week off x 8 cycles (6 months).  Discuss initial assessment once a week during first cycle.  Xeloda shipped out today.  Patient to start tomorrow.  Rx:  ondansetron 4 mg ODT q 8 hours prn nausea (dis: #30; refill 1). 3.   Normocytic anemia Resolved. Hematocrit 43.1, hemoglobin 14.7, and MCV 82.3. Ferritin was 566 on 01/18/2018.   Suspect elevated ferritin is an acute phase reactant. 4.  RTC weekly  x 3 for MD assessment and labs (CBC with diff, CMP).   Honor Loh, NP  03/05/2018, 1:34 PM   I saw and evaluated the patient, participating in the key portions of the service and reviewing pertinent diagnostic studies and records.  I reviewed the nurse practitioner's note and agree with the findings and the plan.  The assessment and plan were discussed with the patient.  Several questions were asked by the patient and answered.   Nolon Stalls, MD 03/05/2018,1:34 PM

## 2018-03-06 LAB — CEA: CEA: 2 ng/mL (ref 0.0–4.7)

## 2018-03-12 ENCOUNTER — Encounter: Payer: Self-pay | Admitting: Hematology and Oncology

## 2018-03-12 ENCOUNTER — Inpatient Hospital Stay: Payer: BLUE CROSS/BLUE SHIELD | Admitting: Hematology and Oncology

## 2018-03-12 ENCOUNTER — Inpatient Hospital Stay: Payer: BLUE CROSS/BLUE SHIELD

## 2018-03-12 VITALS — BP 125/88 | HR 95 | Temp 97.4°F | Resp 18 | Ht 68.0 in | Wt 205.0 lb

## 2018-03-12 DIAGNOSIS — C187 Malignant neoplasm of sigmoid colon: Secondary | ICD-10-CM | POA: Diagnosis not present

## 2018-03-12 DIAGNOSIS — Z800 Family history of malignant neoplasm of digestive organs: Secondary | ICD-10-CM | POA: Insufficient documentation

## 2018-03-12 DIAGNOSIS — R110 Nausea: Secondary | ICD-10-CM

## 2018-03-12 DIAGNOSIS — N182 Chronic kidney disease, stage 2 (mild): Secondary | ICD-10-CM | POA: Insufficient documentation

## 2018-03-12 DIAGNOSIS — Z933 Colostomy status: Secondary | ICD-10-CM | POA: Insufficient documentation

## 2018-03-12 DIAGNOSIS — E119 Type 2 diabetes mellitus without complications: Secondary | ICD-10-CM

## 2018-03-12 DIAGNOSIS — Z7984 Long term (current) use of oral hypoglycemic drugs: Secondary | ICD-10-CM

## 2018-03-12 DIAGNOSIS — Z791 Long term (current) use of non-steroidal anti-inflammatories (NSAID): Secondary | ICD-10-CM

## 2018-03-12 DIAGNOSIS — Z79899 Other long term (current) drug therapy: Secondary | ICD-10-CM | POA: Insufficient documentation

## 2018-03-12 DIAGNOSIS — I10 Essential (primary) hypertension: Secondary | ICD-10-CM | POA: Insufficient documentation

## 2018-03-12 LAB — COMPREHENSIVE METABOLIC PANEL
ALT: 24 U/L (ref 0–44)
AST: 21 U/L (ref 15–41)
Albumin: 4.2 g/dL (ref 3.5–5.0)
Alkaline Phosphatase: 69 U/L (ref 38–126)
Anion gap: 9 (ref 5–15)
BUN: 16 mg/dL (ref 6–20)
CO2: 26 mmol/L (ref 22–32)
Calcium: 8.7 mg/dL — ABNORMAL LOW (ref 8.9–10.3)
Chloride: 98 mmol/L (ref 98–111)
Creatinine, Ser: 1.03 mg/dL (ref 0.61–1.24)
GFR calc Af Amer: >60 mL/min (ref >60)
GFR calc non Af Amer: >60 mL/min (ref >60)
Glucose, Bld: 203 mg/dL — ABNORMAL HIGH (ref 70–99)
Potassium: 3.8 mmol/L (ref 3.5–5.1)
Sodium: 133 mmol/L — ABNORMAL LOW (ref 135–145)
Total Bilirubin: 0.6 mg/dL (ref 0.3–1.2)
Total Protein: 8.5 g/dL — ABNORMAL HIGH (ref 6.5–8.1)

## 2018-03-12 LAB — CBC WITH DIFFERENTIAL/PLATELET
Abs Immature Granulocytes: 0.02 10*3/uL (ref 0.00–0.07)
Basophils Absolute: 0 10*3/uL (ref 0.0–0.1)
Basophils Relative: 1 %
Eosinophils Absolute: 0.2 10*3/uL (ref 0.0–0.5)
Eosinophils Relative: 3 %
HCT: 42.3 % (ref 39.0–52.0)
Hemoglobin: 14.5 g/dL (ref 13.0–17.0)
Immature Granulocytes: 0 %
Lymphocytes Relative: 37 %
Lymphs Abs: 2.4 10*3/uL (ref 0.7–4.0)
MCH: 28.2 pg (ref 26.0–34.0)
MCHC: 34.3 g/dL (ref 30.0–36.0)
MCV: 82.3 fL (ref 80.0–100.0)
Monocytes Absolute: 0.5 10*3/uL (ref 0.1–1.0)
Monocytes Relative: 7 %
Neutro Abs: 3.4 10*3/uL (ref 1.7–7.7)
Neutrophils Relative %: 52 %
Platelets: 166 10*3/uL (ref 150–400)
RBC: 5.14 MIL/uL (ref 4.22–5.81)
RDW: 13.3 % (ref 11.5–15.5)
WBC: 6.4 10*3/uL (ref 4.0–10.5)
nRBC: 0 % (ref 0.0–0.2)

## 2018-03-12 NOTE — Progress Notes (Signed)
[facility] day:  03/12/2018   Chief Complaint: Adam Farrell is a 48 y.o. male with stage IIA sigmoid colon cancer who is seen for assessment on day 7 of cycle #1 Xeloda.  HPI:  The patient was seen last seen in the medical oncology clinic on 03/05/2018.  At that time, he was doing well.  We discussed initiation of Xeloda.  Patient started Xeloda on 03/06/2018, however he notes that he mis-read the bottle and took it incorrectly (2 pills BID rather than 4 pills BID). Patient started full dose therapy on 03/07/2018.  During the interim, patient has experienced some nausea. He has not filled the prescription for the antiemetics. Patient vomited x 1 yesterday, however he attributes it to what he ate yesterday. Patient complains of "tenderness" to the tips of his fingers and his lips. He denies diarrhea. His stools are more firm in his colostomy. He self rates his PLOF 10/10. At the present time, he rates his level of function as 8.5-9/10. He states, "I don't feel as good as I did, but then at the same time, I don't feel bad".   Patient denies that he has experienced any B symptoms. He denies any interval infections. Patient advises that he maintains an adequate appetite. He is eating well. Weight today is 205 lb 0.4 oz (93 kg), which compared to his last visit to the clinic, represents a 5 pound increase.    Patient denies pain in the clinic today.   Past Medical History:  Diagnosis Date  . CKD (chronic kidney disease), stage II   . Colon cancer ([place])   . Diabetes mellitus without complication ([place])   . Hypertension     Past Surgical History:  Procedure Laterality Date  . COLON RESECTION SIGMOID N/A 12/31/2017   Procedure: COLON RESECTION SIGMOID;  Surgeon: Vickie Epley, MD;  Location: [facility] ORS;  Service: General;  Laterality: N/A;  . COLOSTOMY N/A 12/31/2017   Procedure: COLOSTOMY;  Surgeon: Vickie Epley, MD;  Location: [facility] ORS;   Service: General;  Laterality: N/A;    Family History  Adopted: Yes  Problem Relation Age of Onset  . Colon cancer Paternal Aunt     Social History:  reports that he has never smoked. He has never used smokeless tobacco. He reports current alcohol use. No history on file for drug.  Patient drinks beer, sometimes daily. No tobacco use. Patient denies known exposures to radiation on toxins. 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  He has a 39 year old daughter.  The patient's wife's name is Adam Farrell.  He is alone today.  Allergies: No Known Allergies  Current Medications: Current Outpatient Medications  Medication Sig Dispense Refill  . cetirizine (ZYRTEC) 10 MG tablet Take by mouth.    Marland Kitchen lisinopril (PRINIVIL,ZESTRIL) 20 MG tablet Take 20 mg by mouth daily.    . metFORMIN (GLUCOPHAGE-XR) 500 MG 24 hr tablet Take by mouth.    . capecitabine (XELODA) 500 MG tablet Take 4 tablets (2,000 mg total) by mouth 2 (two) times daily after a meal for 14 days. Take on days 1-14. Repeat every 21 days. (Patient not taking: Reported on 03/12/2018) 112 tablet 0  . meloxicam (MOBIC) 15 MG tablet Take 1 tablet (15 mg total) by mouth daily. (Patient not taking: Reported on 12/28/2017) 30 tablet 0  . ondansetron (ZOFRAN-ODT) 4 MG disintegrating tablet Take  1 tablet (4 mg total) by mouth every 8 (eight) hours as needed for nausea or vomiting. (Patient not taking: Reported on 03/12/2018) 30 tablet 1  . oxyCODONE-acetaminophen (PERCOCET/ROXICET) 5-325 MG tablet Take 1 tablet by mouth every 4 (four) hours as needed for severe pain. (Patient not taking: Reported on 01/18/2018) 30 tablet 0   No current facility-administered medications for this visit.     Review of Systems  Constitutional: Negative.  Negative for chills, diaphoresis, fever, malaise/fatigue and weight loss (up 5 pounds).       "I don't feel bad".  HENT: Negative.  Negative for congestion, ear  pain, nosebleeds, sinus pain and sore throat.   Eyes: Negative.  Negative for blurred vision, double vision, photophobia and pain.  Respiratory: Negative.  Negative for cough, hemoptysis, sputum production and shortness of breath.   Cardiovascular: Negative.  Negative for chest pain, palpitations, orthopnea, leg swelling and PND.  Gastrointestinal: Positive for nausea (some) and vomiting (x 1 felt secondary to food he ate). Negative for abdominal pain, blood in stool, constipation, diarrhea, heartburn and melena.       Colostomy.  Genitourinary: Negative.  Negative for dysuria, frequency, hematuria and urgency.  Musculoskeletal: Negative.  Negative for back pain, falls, joint pain, myalgias and neck pain.  Skin: Negative.  Negative for itching and rash.  Neurological: Positive for sensory change (tenderness tips of fingers). Negative for dizziness, tremors, speech change, weakness and headaches.  Endo/Heme/Allergies: Negative.  Does not bruise/bleed easily.  Psychiatric/Behavioral: Negative.  Negative for depression and memory loss. The patient is not nervous/anxious and does not have insomnia.   All other systems reviewed and are negative.  Performance status (ECOG): 0  Vital Signs BP 125/88 (BP Location: Left Arm, Patient Position: Sitting)   Pulse 95   Temp (!) 97.4 F (36.3 C) (Tympanic)   Resp 18   Ht '5\' 8"'$  (1.727 m)   Wt 205 lb 0.4 oz (93 kg)   SpO2 99%   BMI 31.17 kg/m   Physical Exam  Constitutional: He is oriented to person, place, and time and well-developed, well-nourished, and in no distress. No distress.  HENT:  Head: Normocephalic and atraumatic.  Mouth/Throat: Oropharynx is clear and moist and mucous membranes are normal. No oropharyngeal exudate.  Graying hair and beard.  Eyes: Pupils are equal, round, and reactive to light. Conjunctivae and EOM are normal. No scleral icterus.  Neck: Normal range of motion. Neck supple. No JVD present.  Cardiovascular: Normal rate,  regular rhythm, normal heart sounds and intact distal pulses. Exam reveals no gallop and no friction rub.  No murmur heard. Pulmonary/Chest: Effort normal and breath sounds normal. No respiratory distress. He has no wheezes. He has no rales.  Abdominal: Soft. Bowel sounds are normal. He exhibits no distension and no mass. There is no abdominal tenderness. There is no rebound and no guarding.  Colostomy; brown liquid stool.   Musculoskeletal: Normal range of motion.        General: No tenderness or edema.  Lymphadenopathy:    He has no cervical adenopathy.  Neurological: He is alert and oriented to person, place, and time. Gait normal.  Skin: Skin is warm and dry. No rash noted. He is not diaphoretic. No erythema. No pallor.  No change in fingertips.  Psychiatric: Mood, affect and judgment normal.  Nursing note and vitals reviewed.   Appointment on 03/12/2018  Component Date Value Ref Range Status  . Sodium 03/12/2018 133* 135 - 145 mmol/L Final  . Potassium  03/12/2018 3.8  3.5 - 5.1 mmol/L Final  . Chloride 03/12/2018 98  98 - 111 mmol/L Final  . CO2 03/12/2018 26  22 - 32 mmol/L Final  . Glucose, Bld 03/12/2018 203* 70 - 99 mg/dL Final  . BUN 03/12/2018 16  6 - 20 mg/dL Final  . Creatinine, Ser 03/12/2018 1.03  0.61 - 1.24 mg/dL Final  . Calcium 03/12/2018 8.7* 8.9 - 10.3 mg/dL Final  . Total Protein 03/12/2018 8.5* 6.5 - 8.1 g/dL Final  . Albumin 03/12/2018 4.2  3.5 - 5.0 g/dL Final  . AST 03/12/2018 21  15 - 41 U/L Final  . ALT 03/12/2018 24  0 - 44 U/L Final  . Alkaline Phosphatase 03/12/2018 69  38 - 126 U/L Final  . Total Bilirubin 03/12/2018 0.6  0.3 - 1.2 mg/dL Final  . GFR calc non Af Amer 03/12/2018 >60  >60 mL/min Final  . GFR calc Af Amer 03/12/2018 >60  >60 mL/min Final  . Anion gap 03/12/2018 9  5 - 15 Final   Performed at [facility], [address], [place], [place] [address]  . WBC 03/12/2018 6.4  4.0 - 10.5 K/uL Final  . RBC 03/12/2018 5.14  4.22 -  5.81 MIL/uL Final  . Hemoglobin 03/12/2018 14.5  13.0 - 17.0 g/dL Final  . HCT 03/12/2018 42.3  39.0 - 52.0 % Final  . MCV 03/12/2018 82.3  80.0 - 100.0 fL Final  . MCH 03/12/2018 28.2  26.0 - 34.0 pg Final  . MCHC 03/12/2018 34.3  30.0 - 36.0 g/dL Final  . RDW 03/12/2018 13.3  11.5 - 15.5 % Final  . Platelets 03/12/2018 166  150 - 400 K/uL Final  . nRBC 03/12/2018 0.0  0.0 - 0.2 % Final  . Neutrophils Relative % 03/12/2018 52  % Final  . Neutro Abs 03/12/2018 3.4  1.7 - 7.7 K/uL Final  . Lymphocytes Relative 03/12/2018 37  % Final  . Lymphs Abs 03/12/2018 2.4  0.7 - 4.0 K/uL Final  . Monocytes Relative 03/12/2018 7  % Final  . Monocytes Absolute 03/12/2018 0.5  0.1 - 1.0 K/uL Final  . Eosinophils Relative 03/12/2018 3  % Final  . Eosinophils Absolute 03/12/2018 0.2  0.0 - 0.5 K/uL Final  . Basophils Relative 03/12/2018 1  % Final  . Basophils Absolute 03/12/2018 0.0  0.0 - 0.1 K/uL Final  . Immature Granulocytes 03/12/2018 0  % Final  . Abs Immature Granulocytes 03/12/2018 0.02  0.00 - 0.07 K/uL Final   Performed at [facility], [address], [place], [place] [address]    Assessment:  JESSICA SEIDMAN is a 48 y.o. male with stage IIA sigmoid colon cancer s/p resection and ostomy on 12/31/2017.  Pathology revealed a 6.3 cm grade II sigmoid adenocarcinoma.  Tumor invaded the muscularis propria into the peri-colorectal tissue.  All margins were negative.  There was no lymphovascular invasion or perineural invasion.  There were no tumor deposits.  Zero of 20 lymph nodes were positive.  Greater omentum excision revealed diffuse acute peritonitis with edema and hemorrhage.  Pathologic stage was T3N0.  MMR/MSI was negative/intact.  He presented with rectal bleeding.  CEA was 2.3 on 01/18/2018.  Oncotype DX testing revealed a recurrence score of 15 which translated to a 12% (95% CI 9-17%) chance of recurrence within 3 years for a T3 tumor with surgery alone.  The risk of recurrence  within 5 years of adjuvant 5FU/LV is 7%.  The  risk of recurrence will likely decrease by 1% with the addition of oxaliplatin (risk 6%).  Colonoscopy on 12/23/2017 revealed fungating and infiltrative partially obstructing large mass 32 cm proximal to the anus.  The mass was 3.5 cm in length and circumferential.  The mass could not be passed with a pediatric scope.  Chest, abdomen and pelvic CT on 12/28/2017 revealed acute inflammatory changes and free fluid associated with the sigmoid colon in the mesentery, centered at the site of the sigmoid colon mass. The differential included complicating features of colonoscopy/biopsy, infectious/inflammatory colitis, or potentially a perforating colon cancer which pre-existed the recent colonoscopy.  There was circumferential thickening of the sigmoid colon, compatible with colon cancer. There were multiple small nodules/lymph nodes of the sigmoid mesentery which extended along the inferior mesenteric neurovascular drainage pathway. These may be pathologic with lymphatic extension versus reactive/inflammatory changes.  CEA has been followed: 2.3 on 01/18/2018 and 2.0 on 03/05/2018.  He is day 7 of cycle #1 Xeloda (started 03/06/2018).  Family history is limited.  He has an aunt with colon cancer at age 2.  Invitae genetic testing was negative.  Symptomatically, he feels good.  He has had some nausea.  He notes emesis x 1 felt secondary to what he ate.  He denies any diarrhea.  Exam is stable.  Plan: 1.   Labs today:  CBC with diff, CMP. 2.  Stage IIA colon cancer  Patient tolerating Xeloda well.  Patient notes initially taking 1/2 dose.  Discuss never making up doses.  A cycle consists of 2 weeks on/1 week off.  Review that cycle #1 will not be extended.  Patient has no diarrhea, mouth sores or hand foot syndrome.  Unclear significance of sensitivity in fingertips. Monitor closely.  Patient to contact clinic if any concerning symptoms between  appointments. 3.   RTC in 1 week for NP assessment and labs (CBC with diff, CMP). 4.   RTC on 03/26/2018 for MD assessment and labs (CBC with diff, CMP).   Honor Loh, NP  03/12/2018, 4:10 PM   I saw and evaluated the patient, participating in the key portions of the service and reviewing pertinent diagnostic studies and records.  I reviewed the nurse practitioner's note and agree with the findings and the plan.  The assessment and plan were discussed with the patient.  A few questions were asked by the patient and answered.   Nolon Stalls, MD 03/12/2018,4:10 PM

## 2018-03-19 ENCOUNTER — Inpatient Hospital Stay: Payer: BLUE CROSS/BLUE SHIELD | Attending: Urgent Care | Admitting: Urgent Care

## 2018-03-19 ENCOUNTER — Inpatient Hospital Stay: Payer: BLUE CROSS/BLUE SHIELD

## 2018-03-19 ENCOUNTER — Other Ambulatory Visit: Payer: Self-pay

## 2018-03-19 DIAGNOSIS — N182 Chronic kidney disease, stage 2 (mild): Secondary | ICD-10-CM | POA: Insufficient documentation

## 2018-03-19 DIAGNOSIS — C187 Malignant neoplasm of sigmoid colon: Secondary | ICD-10-CM

## 2018-03-19 DIAGNOSIS — Z7984 Long term (current) use of oral hypoglycemic drugs: Secondary | ICD-10-CM | POA: Insufficient documentation

## 2018-03-19 DIAGNOSIS — E1122 Type 2 diabetes mellitus with diabetic chronic kidney disease: Secondary | ICD-10-CM | POA: Diagnosis not present

## 2018-03-19 DIAGNOSIS — E871 Hypo-osmolality and hyponatremia: Secondary | ICD-10-CM | POA: Diagnosis not present

## 2018-03-19 DIAGNOSIS — I129 Hypertensive chronic kidney disease with stage 1 through stage 4 chronic kidney disease, or unspecified chronic kidney disease: Secondary | ICD-10-CM | POA: Insufficient documentation

## 2018-03-19 DIAGNOSIS — Z5181 Encounter for therapeutic drug level monitoring: Secondary | ICD-10-CM | POA: Insufficient documentation

## 2018-03-19 DIAGNOSIS — M791 Myalgia, unspecified site: Secondary | ICD-10-CM | POA: Insufficient documentation

## 2018-03-19 DIAGNOSIS — Z79899 Other long term (current) drug therapy: Secondary | ICD-10-CM | POA: Diagnosis not present

## 2018-03-19 DIAGNOSIS — Z933 Colostomy status: Secondary | ICD-10-CM | POA: Diagnosis not present

## 2018-03-19 LAB — COMPREHENSIVE METABOLIC PANEL
ALT: 22 U/L (ref 0–44)
AST: 18 U/L (ref 15–41)
Albumin: 4.4 g/dL (ref 3.5–5.0)
Alkaline Phosphatase: 79 U/L (ref 38–126)
Anion gap: 6 (ref 5–15)
BUN: 18 mg/dL (ref 6–20)
CO2: 27 mmol/L (ref 22–32)
Calcium: 9.2 mg/dL (ref 8.9–10.3)
Chloride: 101 mmol/L (ref 98–111)
Creatinine, Ser: 1.15 mg/dL (ref 0.61–1.24)
GFR calc Af Amer: >60 mL/min (ref >60)
GFR calc non Af Amer: >60 mL/min (ref >60)
Glucose, Bld: 154 mg/dL — ABNORMAL HIGH (ref 70–99)
Potassium: 4.2 mmol/L (ref 3.5–5.1)
Sodium: 134 mmol/L — ABNORMAL LOW (ref 135–145)
Total Bilirubin: 0.6 mg/dL (ref 0.3–1.2)
Total Protein: 8.9 g/dL — ABNORMAL HIGH (ref 6.5–8.1)

## 2018-03-19 LAB — CBC WITH DIFFERENTIAL/PLATELET
Abs Immature Granulocytes: 0.01 10*3/uL (ref 0.00–0.07)
Basophils Absolute: 0 10*3/uL (ref 0.0–0.1)
Basophils Relative: 1 %
Eosinophils Absolute: 0.1 10*3/uL (ref 0.0–0.5)
Eosinophils Relative: 2 %
HCT: 43.7 % (ref 39.0–52.0)
Hemoglobin: 14.9 g/dL (ref 13.0–17.0)
Immature Granulocytes: 0 %
Lymphocytes Relative: 36 %
Lymphs Abs: 2.3 10*3/uL (ref 0.7–4.0)
MCH: 28.2 pg (ref 26.0–34.0)
MCHC: 34.1 g/dL (ref 30.0–36.0)
MCV: 82.8 fL (ref 80.0–100.0)
Monocytes Absolute: 0.4 10*3/uL (ref 0.1–1.0)
Monocytes Relative: 7 %
Neutro Abs: 3.4 10*3/uL (ref 1.7–7.7)
Neutrophils Relative %: 54 %
Platelets: 190 10*3/uL (ref 150–400)
RBC: 5.28 MIL/uL (ref 4.22–5.81)
RDW: 13.9 % (ref 11.5–15.5)
WBC: 6.3 10*3/uL (ref 4.0–10.5)
nRBC: 0 % (ref 0.0–0.2)

## 2018-03-19 MED ORDER — CAPECITABINE 500 MG PO TABS: 1000.0000 mg/m2 | ORAL_TABLET | Freq: Two times a day (BID) | ORAL | 0 refills | Status: DC

## 2018-03-19 NOTE — Progress Notes (Signed)
[facility] day:  03/19/2018   Chief Complaint: Adam Farrell is a 48 y.o. male with stage IIA sigmoid colon cancer who is seen for assessment on day 14 of cycle #1 Xeloda.  HPI:  The patient was seen last seen in the medical oncology clinic on 03/12/2018.  At that time, patient had experienced some nausea, however had not filled the prescription for the prescribed antiemetics.  He complained of "tenderness" to the tips of his fingers and his lips.  No diarrhea.  Mild fatigue.  No B symptoms or recent infections.  Eating well; weight up 5 pounds. Exam is grossly unremarkable.  WBC 6400 ([place] 3400).  Sodium 133 mmol/L.  Glucose elevated at 203 mg/dL.  Calcium low at 8.7 mg/dL.  In the interim, patient notes that he has been more fatigued over the course of the last 3 days.  He complains of joint pain and tenderness to his shoulders mainly at night.  He denies nausea.  Patient's bowels remain stable; he denies diarrhea.  No B symptoms or interval infections.  Patient remains on Xeloda as prescribed.  Patient states, "if I feel like I have for the last 3 days next week after being off of the medication for a week, I really want to talk about going down on the dose".  Patient notes that the previously reported "tenderness" in his fingertips and lips has improved.  Patient notes that the sensation is only in the second digit of his RIGHT hand at this point.  Patient attributes symptoms to "pushing a button on the loader at work a lot".  Patient advises that he maintains an adequate appetite. He is eating well. Weight today is 203 pounds, which compared to his last visit to the clinic, represents a 2 pound decrease.  Patient denies pain in the clinic today.  Past Medical History:  Diagnosis Date  . CKD (chronic kidney disease), stage II   . Colon cancer ([place])   . Diabetes mellitus without complication ([place])   . Hypertension     Past Surgical History:   Procedure Laterality Date  . COLON RESECTION SIGMOID N/A 12/31/2017   Procedure: COLON RESECTION SIGMOID;  Surgeon: Vickie Epley, MD;  Location: [facility] ORS;  Service: General;  Laterality: N/A;  . COLOSTOMY N/A 12/31/2017   Procedure: COLOSTOMY;  Surgeon: Vickie Epley, MD;  Location: [facility] ORS;  Service: General;  Laterality: N/A;    Family History  Adopted: Yes  Problem Relation Age of Onset  . Colon cancer Paternal Aunt     Social History:  reports that he has never smoked. He has never used smokeless tobacco. He reports current alcohol use. No history on file for drug.  Patient drinks beer, sometimes daily. No tobacco use. Patient denies known exposures to radiation on toxins. 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  He has a 63 year old daughter.  The patient's wife's name is [name].  He is accompanied by his wife today.  Allergies: No Known Allergies  Current Medications: Current Outpatient Medications  Medication Sig Dispense Refill  . capecitabine (XELODA) 500 MG tablet Take 4 tablets (2,000 mg total) by mouth 2 (two) times daily after a meal for 14 days. Take on days 1-14. Repeat every 21 days. 112 tablet 0  . lisinopril (PRINIVIL,ZESTRIL) 20 MG tablet Take 20 mg by mouth daily.    . metFORMIN (  GLUCOPHAGE-XR) 500 MG 24 hr tablet Take 500 mg by mouth daily with breakfast.     . ondansetron (ZOFRAN-ODT) 4 MG disintegrating tablet Take 1 tablet (4 mg total) by mouth every 8 (eight) hours as needed for nausea or vomiting. 30 tablet 1  . oxyCODONE-acetaminophen (PERCOCET/ROXICET) 5-325 MG tablet Take 1 tablet by mouth every 4 (four) hours as needed for severe pain. 30 tablet 0   No current facility-administered medications for this visit.     Review of Systems  Constitutional: Positive for malaise/fatigue (increased over last 3 days) and weight loss (down 2 pounds). Negative for diaphoresis and fever.  HENT:  Negative.   Eyes: Negative.   Respiratory: Negative for cough, hemoptysis, sputum production and shortness of breath.   Cardiovascular: Negative for chest pain, palpitations, orthopnea, leg swelling and PND.  Gastrointestinal: Negative for abdominal pain, blood in stool, constipation, diarrhea, melena, nausea (intermittent) and vomiting.       Colostomy  Genitourinary: Negative for dysuria, frequency, hematuria and urgency.  Musculoskeletal: Negative for back pain and falls.       Joint pain and tenderness to bilateral shoulders at night  Skin: Negative for itching and rash.  Neurological: Positive for sensory change (only to tip of 2nd digit on RIGHT hand; occupational related?). Negative for dizziness, tremors, weakness and headaches.  Endo/Heme/Allergies: Does not bruise/bleed easily.  Psychiatric/Behavioral: Negative for depression, memory loss and suicidal ideas. The patient is not nervous/anxious and does not have insomnia.   All other systems reviewed and are negative.  Performance status (ECOG): 0 - Asymptomatic  Vital Signs Temp: 97.4, Pulse: 91, Resp 18, Blood pressure: 125/82, Weight: 203 pounds, Pain: 0/10  Physical Exam  Constitutional: He is oriented to person, place, and time and well-developed, well-nourished, and in no distress.  HENT:  Head: Normocephalic and atraumatic.  Mouth/Throat: Oropharynx is clear and moist and mucous membranes are normal.  Eyes: Pupils are equal, round, and reactive to light. EOM are normal. No scleral icterus.  Neck: Normal range of motion. Neck supple. No tracheal deviation present. No thyromegaly present.  Cardiovascular: Normal rate, regular rhythm, normal heart sounds and intact distal pulses. Exam reveals no gallop and no friction rub.  No murmur heard. Pulmonary/Chest: Effort normal and breath sounds normal. No respiratory distress. He has no wheezes. He has no rales.  Abdominal: Soft. Bowel sounds are normal. He exhibits no distension.  There is no abdominal tenderness.  Colostomy with liquid brown stools.  Musculoskeletal: Normal range of motion.        General: No edema.     Right shoulder: He exhibits tenderness.     Left shoulder: He exhibits tenderness.  Neurological: He is alert and oriented to person, place, and time. A sensory deficit (numbness/tenderness to tip of 2nd digit on RIGHT hand) is present.  Skin: Skin is warm and dry. No rash noted. No erythema.  Psychiatric: Mood, affect and judgment normal.  Nursing note and vitals reviewed.   Clinical Support on 03/19/2018  Component Date Value Ref Range Status  . WBC 03/19/2018 6.3  4.0 - 10.5 K/uL Final  . RBC 03/19/2018 5.28  4.22 - 5.81 MIL/uL Final  . Hemoglobin 03/19/2018 14.9  13.0 - 17.0 g/dL Final  . HCT 03/19/2018 43.7  39.0 - 52.0 % Final  . MCV 03/19/2018 82.8  80.0 - 100.0 fL Final  . MCH 03/19/2018 28.2  26.0 - 34.0 pg Final  . MCHC 03/19/2018 34.1  30.0 - 36.0 g/dL Final  . RDW  03/19/2018 13.9  11.5 - 15.5 % Final  . Platelets 03/19/2018 190  150 - 400 K/uL Final  . nRBC 03/19/2018 0.0  0.0 - 0.2 % Final  . Neutrophils Relative % 03/19/2018 54  % Final  . Neutro Abs 03/19/2018 3.4  1.7 - 7.7 K/uL Final  . Lymphocytes Relative 03/19/2018 36  % Final  . Lymphs Abs 03/19/2018 2.3  0.7 - 4.0 K/uL Final  . Monocytes Relative 03/19/2018 7  % Final  . Monocytes Absolute 03/19/2018 0.4  0.1 - 1.0 K/uL Final  . Eosinophils Relative 03/19/2018 2  % Final  . Eosinophils Absolute 03/19/2018 0.1  0.0 - 0.5 K/uL Final  . Basophils Relative 03/19/2018 1  % Final  . Basophils Absolute 03/19/2018 0.0  0.0 - 0.1 K/uL Final  . Immature Granulocytes 03/19/2018 0  % Final  . Abs Immature Granulocytes 03/19/2018 0.01  0.00 - 0.07 K/uL Final   Performed at [facility], [address], [place], [place] [address]  . Sodium 03/19/2018 134* 135 - 145 mmol/L Final  . Potassium 03/19/2018 4.2  3.5 - 5.1 mmol/L Final  . Chloride 03/19/2018 101  98 - 111  mmol/L Final  . CO2 03/19/2018 27  22 - 32 mmol/L Final  . Glucose, Bld 03/19/2018 154* 70 - 99 mg/dL Final  . BUN 03/19/2018 18  6 - 20 mg/dL Final  . Creatinine, Ser 03/19/2018 1.15  0.61 - 1.24 mg/dL Final  . Calcium 03/19/2018 9.2  8.9 - 10.3 mg/dL Final  . Total Protein 03/19/2018 8.9* 6.5 - 8.1 g/dL Final  . Albumin 03/19/2018 4.4  3.5 - 5.0 g/dL Final  . AST 03/19/2018 18  15 - 41 U/L Final  . ALT 03/19/2018 22  0 - 44 U/L Final  . Alkaline Phosphatase 03/19/2018 79  38 - 126 U/L Final  . Total Bilirubin 03/19/2018 0.6  0.3 - 1.2 mg/dL Final  . GFR calc non Af Amer 03/19/2018 >60  >60 mL/min Final  . GFR calc Af Amer 03/19/2018 >60  >60 mL/min Final  . Anion gap 03/19/2018 6  5 - 15 Final   Performed at [facility], [address], [place], [place] [address]    Assessment:  IRAN ROWE is a 48 y.o. male with stage IIA sigmoid colon cancer s/p resection and ostomy on 12/31/2017.  Pathology revealed a 6.3 cm grade II sigmoid adenocarcinoma.  Tumor invaded the muscularis propria into the peri-colorectal tissue.  All margins were negative.  There was no lymphovascular invasion or perineural invasion.  There were no tumor deposits.  Zero of 20 lymph nodes were positive.  Greater omentum excision revealed diffuse acute peritonitis with edema and hemorrhage.  Pathologic stage was T3N0.  MMR/MSI was negative/intact.  He presented with rectal bleeding.  CEA was 2.3 on 01/18/2018.  Oncotype DX testing revealed a recurrence score of 15 which translated to a 12% (95% CI 9-17%) chance of recurrence within 3 years for a T3 tumor with surgery alone.  The risk of recurrence within 5 years of adjuvant 5FU/LV is 7%.  The risk of recurrence will likely decrease by 1% with the addition of oxaliplatin (risk 6%).  Colonoscopy on 12/23/2017 revealed fungating and infiltrative partially obstructing large mass 32 cm proximal to the anus.  The mass was 3.5 cm in length and circumferential.  The  mass could not be passed with a pediatric scope.  Chest, abdomen and pelvic CT on 12/28/2017 revealed acute inflammatory changes  and free fluid associated with the sigmoid colon in the mesentery, centered at the site of the sigmoid colon mass. The differential included complicating features of colonoscopy/biopsy, infectious/inflammatory colitis, or potentially a perforating colon cancer which pre-existed the recent colonoscopy.  There was circumferential thickening of the sigmoid colon, compatible with colon cancer. There were multiple small nodules/lymph nodes of the sigmoid mesentery which extended along the inferior mesenteric neurovascular drainage pathway. These may be pathologic with lymphatic extension versus reactive/inflammatory changes.  CEA has been followed: 2.3 on 01/18/2018 and 2.0 on 03/05/2018.  He is day 14 of cycle #1 Xeloda (started 03/06/2018).  Family history is limited.  He has an aunt with colon cancer at age 75.  [facility] genetic testing was negative.  Symptomatically, patient is fatigued.  He denies any increased nausea.  No diarrhea.  Complains of numbness/tenderness to the tip of the second digit on his RIGHT hand, which is attributed to occupational etiology.  Additionally, he complains of tenderness to his shoulders, mainly at night.  Appetite stable; weight down 2 pounds.  Exam is grossly unremarkable.  WBC 6300 ([employer] 3400).  Sodium 134 mmol/L.  Glucose elevated at 154 mg/dL.  Calcium has returned to normal at 9.2 mg/dL.   Plan: 1. Labs today:  CBC with diff, CMP. 2. Stage IIA colon cancer  Doing well overall.  Tolerating Xeloda with minimal side effects (fatigue).  Nausea minimal.  No diarrhea.  Labs reviewed as normal.  Reviewed plans for therapy.  Cycle consist of 2 weeks on, followed by 1 week off.  Patient begins week off tomorrow.  Continues to voice concerns with regards to medication dose.  Wishes to discuss dose reduction if not feeling better next  week following week off of therapy. 3. Shoulder tenderness and joint pain  Review statistics associated with Xeloda.  Myalgia (</= 9%), arthralgia (8%), ostealgia (<5%)  Discuss conservative management PRN using APAP and/or IBU.  Patient reluctant to take any medications citing that he "doesn't like to take medicine".   Will consider Tramadol if not effective.  4. Finger sensitivity  Improved.  Numbness and tenderness to the tip of the 2nd digit on the RIGHT hand only at this point  Attributes symptom to "pushing button on loader at work".  Continue close monitoring for progression. 5. RTC on 03/26/2018 for MD assessment and labs (CBC with differential, CMP, CEA)  Honor Loh, NP  03/19/2018, [id number]

## 2018-03-19 NOTE — Progress Notes (Signed)
Pt here for follow up. Reports he has been experiencing bilateral shoulder pain x 2 weeks that is sharp. Reports he primarily notices this at night and rates it as a 6-7/10 pain. Denies any other concerns at this time.Adam Farrell

## 2018-03-24 ENCOUNTER — Other Ambulatory Visit: Payer: Self-pay | Admitting: Urgent Care

## 2018-03-24 DIAGNOSIS — C187 Malignant neoplasm of sigmoid colon: Secondary | ICD-10-CM

## 2018-03-26 ENCOUNTER — Other Ambulatory Visit: Payer: Self-pay

## 2018-03-26 ENCOUNTER — Inpatient Hospital Stay: Payer: BLUE CROSS/BLUE SHIELD | Admitting: Urgent Care

## 2018-03-26 ENCOUNTER — Inpatient Hospital Stay: Payer: BLUE CROSS/BLUE SHIELD

## 2018-03-26 VITALS — BP 122/79 | HR 87 | Temp 97.1°F | Resp 16 | Wt 206.2 lb

## 2018-03-26 DIAGNOSIS — E871 Hypo-osmolality and hyponatremia: Secondary | ICD-10-CM

## 2018-03-26 DIAGNOSIS — C187 Malignant neoplasm of sigmoid colon: Secondary | ICD-10-CM | POA: Diagnosis not present

## 2018-03-26 DIAGNOSIS — Z5181 Encounter for therapeutic drug level monitoring: Secondary | ICD-10-CM

## 2018-03-26 DIAGNOSIS — M25511 Pain in right shoulder: Secondary | ICD-10-CM

## 2018-03-26 DIAGNOSIS — M25512 Pain in left shoulder: Secondary | ICD-10-CM

## 2018-03-26 DIAGNOSIS — Z79899 Other long term (current) drug therapy: Secondary | ICD-10-CM | POA: Diagnosis not present

## 2018-03-26 DIAGNOSIS — C189 Malignant neoplasm of colon, unspecified: Secondary | ICD-10-CM

## 2018-03-26 DIAGNOSIS — R5383 Other fatigue: Secondary | ICD-10-CM

## 2018-03-26 LAB — CBC WITH DIFFERENTIAL/PLATELET
Abs Immature Granulocytes: 0.01 10*3/uL (ref 0.00–0.07)
Basophils Absolute: 0 10*3/uL (ref 0.0–0.1)
Basophils Relative: 0 %
Eosinophils Absolute: 0.1 10*3/uL (ref 0.0–0.5)
Eosinophils Relative: 2 %
HCT: 39.8 % (ref 39.0–52.0)
Hemoglobin: 13.8 g/dL (ref 13.0–17.0)
Immature Granulocytes: 0 %
Lymphocytes Relative: 36 %
Lymphs Abs: 2.1 10*3/uL (ref 0.7–4.0)
MCH: 28.7 pg (ref 26.0–34.0)
MCHC: 34.7 g/dL (ref 30.0–36.0)
MCV: 82.7 fL (ref 80.0–100.0)
MONOS PCT: 8 %
Monocytes Absolute: 0.5 10*3/uL (ref 0.1–1.0)
Neutro Abs: 3.1 10*3/uL (ref 1.7–7.7)
Neutrophils Relative %: 54 %
PLATELETS: 152 10*3/uL (ref 150–400)
RBC: 4.81 MIL/uL (ref 4.22–5.81)
RDW: 14.8 % (ref 11.5–15.5)
WBC: 5.9 10*3/uL (ref 4.0–10.5)
nRBC: 0 % (ref 0.0–0.2)

## 2018-03-26 LAB — COMPREHENSIVE METABOLIC PANEL
ALBUMIN: 4 g/dL (ref 3.5–5.0)
ALT: 25 U/L (ref 0–44)
AST: 21 U/L (ref 15–41)
Alkaline Phosphatase: 77 U/L (ref 38–126)
Anion gap: 5 (ref 5–15)
BUN: 18 mg/dL (ref 6–20)
CO2: 27 mmol/L (ref 22–32)
Calcium: 8.9 mg/dL (ref 8.9–10.3)
Chloride: 101 mmol/L (ref 98–111)
Creatinine, Ser: 1.04 mg/dL (ref 0.61–1.24)
GFR calc Af Amer: >60 mL/min (ref >60)
GFR calc non Af Amer: >60 mL/min (ref >60)
Glucose, Bld: 146 mg/dL — ABNORMAL HIGH (ref 70–99)
POTASSIUM: 4 mmol/L (ref 3.5–5.1)
Sodium: 133 mmol/L — ABNORMAL LOW (ref 135–145)
Total Bilirubin: 0.5 mg/dL (ref 0.3–1.2)
Total Protein: 8.1 g/dL (ref 6.5–8.1)

## 2018-03-26 NOTE — Progress Notes (Signed)
[facility] day:  03/26/2018   Chief Complaint: Adam Farrell is a 48 y.o. male with stage IIA sigmoid colon cancer who is seen for assessment prior to initiation of cycle #2 Xeloda.  HPI:  The patient was seen last seen in the medical oncology clinic on 03/19/2018.  At that time, patient complained of being more fatigued x3 days.  He complained of joint pain and tenderness in his shoulders mainly at night.  No nausea, vomiting, or changes to his bowel habits.  "Tenderness" to his fingertips and lips had improved.  Eating well; weight down 2 pounds.  Exam was grossly unremarkable.  WBC 6300 ([place] 3400).  Sodium 134 mmol/L.  Glucose elevated at 154 mg/dL.  Patient tolerated initial cycle of Xeloda (days 1-14 every 21 days) well.  He notes only minimal fatigue and minor joint and muscle pain.  In the interim, patient has been doing well.  He notes that his energy has improved with his week off of therapy.  Energy is reported to be back at baseline.  He denies any nausea, vomiting, or changes to his bowel habits.  He has not experienced any B symptoms or interval infections.  No mucositis or thrush. Patient denies palmar-plantar erythrodysesthesia, neuropathy, and cold sensitivity associated with the current treatment regimen.   Patient continues to complain of joint pain and muscle aches, mainly in his shoulders, since he started on treatment with Xeloda.  Reviewed incidence rates of associated side effects; myalgia (</= 9%), arthralgia (8%), ostealgia (<5%).  Patient advises that he maintains an adequate appetite. He is eating well. Weight today is 206 lb 3.9 oz (93.6 kg), which compared to his last visit to the clinic, represents a 3 pound increase.   Past Medical History:  Diagnosis Date  . CKD (chronic kidney disease), stage II   . Colon cancer ([place])   . Diabetes mellitus without complication ([place])   . Hypertension     Past Surgical History:   Procedure Laterality Date  . COLON RESECTION SIGMOID N/A 12/31/2017   Procedure: COLON RESECTION SIGMOID;  Surgeon: Vickie Epley, MD;  Location: [facility] ORS;  Service: General;  Laterality: N/A;  . COLOSTOMY N/A 12/31/2017   Procedure: COLOSTOMY;  Surgeon: Vickie Epley, MD;  Location: [facility] ORS;  Service: General;  Laterality: N/A;    Family History  Adopted: Yes  Problem Relation Age of Onset  . Colon cancer Paternal Aunt     Social History:  reports that he has never smoked. He has never used smokeless tobacco. He reports current alcohol use. No history on file for drug.  Patient drinks beer, sometimes daily. No tobacco use. Patient denies known exposures to radiation on toxins. 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  He has a 9 year old daughter.  The patient's wife's name is [name].  He is accompanied by his wife today.  Allergies: No Known Allergies  Current Medications: Current Outpatient Medications  Medication Sig Dispense Refill  . capecitabine (XELODA) 500 MG tablet TAKE 4 TABLETS BY MOUTH TWICE DAILY FOR 14 DAYS OF A 21 DAY CYCLE. 112 tablet 0  . lisinopril (PRINIVIL,ZESTRIL) 20 MG tablet Take 20 mg by mouth daily.    . metFORMIN (GLUCOPHAGE-XR) 500 MG 24 hr tablet Take 500 mg by mouth daily with breakfast.     . ondansetron (ZOFRAN-ODT) 4 MG disintegrating tablet Take 1 tablet (  4 mg total) by mouth every 8 (eight) hours as needed for nausea or vomiting. 30 tablet 1  . oxyCODONE-acetaminophen (PERCOCET/ROXICET) 5-325 MG tablet Take 1 tablet by mouth every 4 (four) hours as needed for severe pain. 30 tablet 0   No current facility-administered medications for this visit.     Review of Systems  Constitutional: Negative for diaphoresis, fever, malaise/fatigue (increased over last 3 days) and weight loss (up 3  pounds).       "I am feeling pretty good.  My energy is better".  Energy back to baseline.   HENT: Negative.   Eyes: Negative.   Respiratory: Negative for cough, hemoptysis, sputum production and shortness of breath.   Cardiovascular: Negative for chest pain, palpitations, orthopnea, leg swelling and PND.  Gastrointestinal: Negative for abdominal pain, blood in stool, constipation, diarrhea, melena, nausea and vomiting.       Colostomy  Genitourinary: Negative for dysuria, frequency, hematuria and urgency.  Musculoskeletal: Negative for back pain and falls.       Joint pain and tenderness to bilateral shoulders at night  Skin: Negative for itching and rash.  Neurological: Negative for dizziness, tremors, sensory change (resolved), weakness and headaches.  Endo/Heme/Allergies: Does not bruise/bleed easily.  Psychiatric/Behavioral: Negative for depression, memory loss and suicidal ideas. The patient is not nervous/anxious and does not have insomnia.   All other systems reviewed and are negative.  Performance status (ECOG): 0 - Asymptomatic  Vital Signs BP 122/79 (BP Location: Left Arm, Patient Position: Sitting)   Pulse 87   Temp (!) 97.1 F (36.2 C) (Tympanic)   Resp 16   Wt 206 lb 3.9 oz (93.6 kg)   SpO2 100%   BMI 31.36 kg/m   Physical Exam  Constitutional: He is oriented to person, place, and time and well-developed, well-nourished, and in no distress.  HENT:  Head: Normocephalic and atraumatic.  Mouth/Throat: Oropharynx is clear and moist and mucous membranes are normal.  Eyes: Pupils are equal, round, and reactive to light. EOM are normal. No scleral icterus.  Neck: Normal range of motion. Neck supple. No tracheal deviation present. No thyromegaly present.  Cardiovascular: Normal rate, regular rhythm, normal heart sounds and intact distal pulses. Exam reveals no gallop and no friction rub.  No murmur heard. Pulmonary/Chest: Effort normal and breath sounds normal. No respiratory distress. He has no wheezes. He has no rales.  Abdominal: Soft. Bowel sounds are  normal. He exhibits no distension. There is no abdominal tenderness.  Colostomy with liquid brown stools.  Musculoskeletal: Normal range of motion.        General: No edema.     Right shoulder: He exhibits tenderness.     Left shoulder: He exhibits tenderness.  Neurological: He is alert and oriented to person, place, and time.  Skin: Skin is warm and dry. No rash noted. No erythema.  Psychiatric: Mood, affect and judgment normal.  Nursing note and vitals reviewed.   Appointment on 03/26/2018  Component Date Value Ref Range Status  . CEA 03/26/2018 2.1  0.0 - 4.7 ng/mL Final   Comment: (NOTE)                             Nonsmokers          <3.9                             Smokers             <  5.6 [employer] Electrochemiluminescence Immunoassay (ECLIA) Values obtained with different assay methods or kits cannot be used interchangeably.  Results cannot be interpreted as absolute evidence of the presence or absence of malignant disease. Performed At: [facility] [place], [place] [id number] Rush Farmer MD [id number]   . Sodium 03/26/2018 133* 135 - 145 mmol/L Final  . Potassium 03/26/2018 4.0  3.5 - 5.1 mmol/L Final  . Chloride 03/26/2018 101  98 - 111 mmol/L Final  . CO2 03/26/2018 27  22 - 32 mmol/L Final  . Glucose, Bld 03/26/2018 146* 70 - 99 mg/dL Final  . BUN 03/26/2018 18  6 - 20 mg/dL Final  . Creatinine, Ser 03/26/2018 1.04  0.61 - 1.24 mg/dL Final  . Calcium 03/26/2018 8.9  8.9 - 10.3 mg/dL Final  . Total Protein 03/26/2018 8.1  6.5 - 8.1 g/dL Final  . Albumin 03/26/2018 4.0  3.5 - 5.0 g/dL Final  . AST 03/26/2018 21  15 - 41 U/L Final  . ALT 03/26/2018 25  0 - 44 U/L Final  . Alkaline Phosphatase 03/26/2018 77  38 - 126 U/L Final  . Total Bilirubin 03/26/2018 0.5  0.3 - 1.2 mg/dL Final  . GFR calc non Af Amer 03/26/2018 >60  >60 mL/min Final  . GFR calc Af Amer 03/26/2018 >60  >60 mL/min Final  . Anion gap 03/26/2018 5  5 - 15  Final   Performed at [facility], [address], [place], [place] [address]  . WBC 03/26/2018 5.9  4.0 - 10.5 K/uL Final  . RBC 03/26/2018 4.81  4.22 - 5.81 MIL/uL Final  . Hemoglobin 03/26/2018 13.8  13.0 - 17.0 g/dL Final  . HCT 03/26/2018 39.8  39.0 - 52.0 % Final  . MCV 03/26/2018 82.7  80.0 - 100.0 fL Final  . MCH 03/26/2018 28.7  26.0 - 34.0 pg Final  . MCHC 03/26/2018 34.7  30.0 - 36.0 g/dL Final  . RDW 03/26/2018 14.8  11.5 - 15.5 % Final  . Platelets 03/26/2018 152  150 - 400 K/uL Final  . nRBC 03/26/2018 0.0  0.0 - 0.2 % Final  . Neutrophils Relative % 03/26/2018 54  % Final  . Neutro Abs 03/26/2018 3.1  1.7 - 7.7 K/uL Final  . Lymphocytes Relative 03/26/2018 36  % Final  . Lymphs Abs 03/26/2018 2.1  0.7 - 4.0 K/uL Final  . Monocytes Relative 03/26/2018 8  % Final  . Monocytes Absolute 03/26/2018 0.5  0.1 - 1.0 K/uL Final  . Eosinophils Relative 03/26/2018 2  % Final  . Eosinophils Absolute 03/26/2018 0.1  0.0 - 0.5 K/uL Final  . Basophils Relative 03/26/2018 0  % Final  . Basophils Absolute 03/26/2018 0.0  0.0 - 0.1 K/uL Final  . Immature Granulocytes 03/26/2018 0  % Final  . Abs Immature Granulocytes 03/26/2018 0.01  0.00 - 0.07 K/uL Final   Performed at [facility], [address], [place], [place] [address]    Assessment:  MOO GRAVLEY is a 48 y.o. male with stage IIA sigmoid colon cancer s/p resection and ostomy on 12/31/2017.  Pathology revealed a 6.3 cm grade II sigmoid adenocarcinoma.  Tumor invaded the muscularis propria into the peri-colorectal tissue.  All margins were negative.  There was no lymphovascular invasion or perineural invasion.  There were no tumor deposits.  Zero of 20 lymph nodes were positive.  Greater omentum excision revealed diffuse acute peritonitis with edema and hemorrhage.  Pathologic stage  was T3N0.  MMR/MSI was negative/intact.  He presented with rectal bleeding.  CEA was 2.3 on 01/18/2018.  Oncotype DX testing  revealed a recurrence score of 15 which translated to a 12% (95% CI 9-17%) chance of recurrence within 3 years for a T3 tumor with surgery alone.  The risk of recurrence within 5 years of adjuvant 5FU/LV is 7%.  The risk of recurrence will likely decrease by 1% with the addition of oxaliplatin (risk 6%).  Colonoscopy on 12/23/2017 revealed fungating and infiltrative partially obstructing large mass 32 cm proximal to the anus.  The mass was 3.5 cm in length and circumferential.  The mass could not be passed with a pediatric scope.  Chest, abdomen and pelvic CT on 12/28/2017 revealed acute inflammatory changes and free fluid associated with the sigmoid colon in the mesentery, centered at the site of the sigmoid colon mass. The differential included complicating features of colonoscopy/biopsy, infectious/inflammatory colitis, or potentially a perforating colon cancer which pre-existed the recent colonoscopy.  There was circumferential thickening of the sigmoid colon, compatible with colon cancer. There were multiple small nodules/lymph nodes of the sigmoid mesentery which extended along the inferior mesenteric neurovascular drainage pathway. These may be pathologic with lymphatic extension versus reactive/inflammatory changes.  CEA has been followed: 2.3 on 01/18/2018 and 2.0 on 03/05/2018.  He is s/p cycle #1 Xeloda (03/06/2018).  Family history is limited.  He has an aunt with colon cancer at age 68.  [facility] genetic testing was negative.  Symptomatically, patient is feeling overall.  He notes that his energy is back to baseline.  No treatment related side effects noted.  He denies nausea, vomiting, or changes to his bowel habits.  No mucositis symptoms.  He denies neuropathy.  He complains of some minor joint pain and muscle aches in his shoulders bilaterally. Eating well; weight up 3 pounds.  Exam is at baseline.  WBC 5900 ([place] 3100).  Platelets 152,000.  Sodium 133 mmol/L.  Glucose elevated 146 mg/dL.   CEA is stable at 2.1 ng/mL.   Plan: 1. Labs today:  CBC with diff, CMP, CEA. 2. Stage IIA colon cancer  Doing well overall.  Tolerating Xeloda with minimal side effects (fatigue and joint aches).  Nausea resolved.  No diarrhea.  Labs reviewed as normal.  Reviewed plans for therapy.  Cycle consists of 2 weeks on, followed by 1 week off.  Labs reviewed. Blood counts stable and adequate enough for treatment. Will proceed with initiation of cycle 2 Xeloda on 03/27/2018.  Continues to voice concerns with regards to medication dose.  Wishes to discuss truncation of treatment course citing that he feels as if 6 months is too long.  3. Shoulder tenderness and joint pain  Review statistics associated with Xeloda.  Myalgia (</= 9%), arthralgia (8%), ostealgia (<5%)  Discuss conservative management PRN using APAP and/or IBU.  Patient reluctant to take any medications citing that he "doesn't like to take medicine".   Will consider Tramadol if not effective.  4. HYPOnatremia  Sodium low at 133 mmol/L.  Likely dilutional, as patient drinks lots of water while at work.  Encouraged ORS (Gatorade or Pedialyte) to supplement fluid intake to prevent further electrolyte derangements.  Will continue routine lab monitoring. 5. Finger sensitivity  Resolved.  Numbness and tenderness to the tip of the 2nd digit on the RIGHT hand only in the past.   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  Continue close monitoring for progression. 6. RTC in 3 weeks or  MD assessment and labs (CBC with differential, CMP, CEA)  Honor Loh, NP  03/26/2018, 03/26/18

## 2018-03-26 NOTE — Progress Notes (Signed)
Pt here for follow up. Reports some sinus drainage (clear in color) started yesterday. Denies any concerns at this time.

## 2018-03-27 LAB — CEA: CEA: 2.1 ng/mL (ref 0.0–4.7)

## 2018-04-05 ENCOUNTER — Telehealth: Payer: Self-pay | Admitting: Hematology and Oncology

## 2018-04-05 NOTE — Telephone Encounter (Signed)
Re:  Xeloda side effects  Patient called describing symptoms he feels are associated with Xeloda made him come home from work.  She notes moderate nausea.  He has had joint aches.  He had 1 episode of diarrhea, possibly related to his diet.  He denies any mouth sores or tenderness.  He denies any hand foot syndrome.  It is unclear if his joint aches are due to Xeloda.  We discussed holding Xeloda for 1 day and phone follow-up tomorrow.  Today is day 10 of cycle #2 Xeloda.  Consideration will be made for possible dose reduction with his next cycle.  Last dose is Friday, 04/09/2018.   Lequita Asal, MD

## 2018-04-09 ENCOUNTER — Telehealth: Payer: Self-pay | Admitting: Hematology and Oncology

## 2018-04-09 NOTE — Telephone Encounter (Signed)
Re:  Xeloda  I spoke with the patient on 04/06/2018 regarding his tolerance of Xeloda.  He had been off of work for 2 days secondary to fatigue, nausea, and soreness in his shoulders.  He denied any mouth sores, hand foot syndrome, or diarrhea.  He had held his Xeloda x 2 days.  I discussed decreasing his dose to 1500 mg BID beginning 04/07/2018 if he felt able to return to work.  The last day of Xeloda for this cycle is 04/09/2018.   Lequita Asal, MD

## 2018-04-13 ENCOUNTER — Ambulatory Visit: Payer: BLUE CROSS/BLUE SHIELD | Admitting: Surgery

## 2018-04-15 ENCOUNTER — Telehealth: Payer: Self-pay | Admitting: Hematology and Oncology

## 2018-04-15 ENCOUNTER — Other Ambulatory Visit: Payer: Self-pay

## 2018-04-16 ENCOUNTER — Other Ambulatory Visit: Payer: Self-pay

## 2018-04-16 ENCOUNTER — Encounter: Payer: Self-pay | Admitting: Hematology and Oncology

## 2018-04-16 ENCOUNTER — Telehealth (INDEPENDENT_AMBULATORY_CARE_PROVIDER_SITE_OTHER): Payer: BLUE CROSS/BLUE SHIELD | Admitting: Surgery

## 2018-04-16 ENCOUNTER — Inpatient Hospital Stay: Payer: BLUE CROSS/BLUE SHIELD | Attending: Hematology and Oncology | Admitting: Hematology and Oncology

## 2018-04-16 ENCOUNTER — Inpatient Hospital Stay: Payer: BLUE CROSS/BLUE SHIELD

## 2018-04-16 DIAGNOSIS — I129 Hypertensive chronic kidney disease with stage 1 through stage 4 chronic kidney disease, or unspecified chronic kidney disease: Secondary | ICD-10-CM | POA: Insufficient documentation

## 2018-04-16 DIAGNOSIS — R5383 Other fatigue: Secondary | ICD-10-CM

## 2018-04-16 DIAGNOSIS — Z933 Colostomy status: Secondary | ICD-10-CM | POA: Insufficient documentation

## 2018-04-16 DIAGNOSIS — C187 Malignant neoplasm of sigmoid colon: Secondary | ICD-10-CM

## 2018-04-16 DIAGNOSIS — Z7984 Long term (current) use of oral hypoglycemic drugs: Secondary | ICD-10-CM | POA: Insufficient documentation

## 2018-04-16 DIAGNOSIS — Z79899 Other long term (current) drug therapy: Secondary | ICD-10-CM | POA: Insufficient documentation

## 2018-04-16 DIAGNOSIS — E1122 Type 2 diabetes mellitus with diabetic chronic kidney disease: Secondary | ICD-10-CM | POA: Diagnosis not present

## 2018-04-16 DIAGNOSIS — N182 Chronic kidney disease, stage 2 (mild): Secondary | ICD-10-CM | POA: Diagnosis not present

## 2018-04-16 DIAGNOSIS — Z9049 Acquired absence of other specified parts of digestive tract: Secondary | ICD-10-CM | POA: Diagnosis not present

## 2018-04-16 DIAGNOSIS — E871 Hypo-osmolality and hyponatremia: Secondary | ICD-10-CM | POA: Diagnosis not present

## 2018-04-16 LAB — CBC WITH DIFFERENTIAL/PLATELET
Abs Immature Granulocytes: 0.02 10*3/uL (ref 0.00–0.07)
Basophils Absolute: 0 10*3/uL (ref 0.0–0.1)
Basophils Relative: 1 %
Eosinophils Absolute: 0.2 10*3/uL (ref 0.0–0.5)
Eosinophils Relative: 3 %
HCT: 41.4 % (ref 39.0–52.0)
Hemoglobin: 14.2 g/dL (ref 13.0–17.0)
Immature Granulocytes: 0 %
Lymphocytes Relative: 30 %
Lymphs Abs: 1.6 10*3/uL (ref 0.7–4.0)
MCH: 29.2 pg (ref 26.0–34.0)
MCHC: 34.3 g/dL (ref 30.0–36.0)
MCV: 85.2 fL (ref 80.0–100.0)
Monocytes Absolute: 0.3 10*3/uL (ref 0.1–1.0)
Monocytes Relative: 6 %
Neutro Abs: 3.1 10*3/uL (ref 1.7–7.7)
Neutrophils Relative %: 60 %
Platelets: 145 10*3/uL — ABNORMAL LOW (ref 150–400)
RBC: 4.86 MIL/uL (ref 4.22–5.81)
RDW: 16.8 % — ABNORMAL HIGH (ref 11.5–15.5)
WBC: 5.3 10*3/uL (ref 4.0–10.5)
nRBC: 0 % (ref 0.0–0.2)

## 2018-04-16 LAB — COMPREHENSIVE METABOLIC PANEL
ALT: 40 U/L (ref 0–44)
AST: 32 U/L (ref 15–41)
Albumin: 4.1 g/dL (ref 3.5–5.0)
Alkaline Phosphatase: 81 U/L (ref 38–126)
Anion gap: 7 (ref 5–15)
BUN: 18 mg/dL (ref 6–20)
CO2: 23 mmol/L (ref 22–32)
Calcium: 8.7 mg/dL — ABNORMAL LOW (ref 8.9–10.3)
Chloride: 104 mmol/L (ref 98–111)
Creatinine, Ser: 1.1 mg/dL (ref 0.61–1.24)
GFR calc Af Amer: >60 mL/min (ref >60)
GFR calc non Af Amer: >60 mL/min (ref >60)
Glucose, Bld: 165 mg/dL — ABNORMAL HIGH (ref 70–99)
Potassium: 4.2 mmol/L (ref 3.5–5.1)
Sodium: 134 mmol/L — ABNORMAL LOW (ref 135–145)
Total Bilirubin: 1.2 mg/dL (ref 0.3–1.2)
Total Protein: 8.2 g/dL — ABNORMAL HIGH (ref 6.5–8.1)

## 2018-04-16 LAB — FOLATE: Folate: 19.9 ng/mL (ref >5.9)

## 2018-04-16 LAB — TSH: TSH: 2.011 u[IU]/mL (ref 0.350–4.500)

## 2018-04-16 NOTE — Progress Notes (Signed)
[facility]  [address] [place], [place] [address] Phone: [phone]  Fax: [phone]   Telephone Office Visit:  04/16/2018  I connected with Adam Farrell on 04/16/18 at 3:57 PM EDT by telephone and verified that I was speaking with the correct person using 2 identifiers.  The patient was on a piece of equipment.  I discussed the limitations, risk, security and privacy concerns of performing an evaluation and management service by telephone and  the availability of in person appointments.  I also discussed with the patient that there may be a patient responsible charge related to this service.  The patient expressed understanding and agreed to proceed.   Chief Complaint: Adam Farrell is a 48 y.o. male with stage IIA sigmoid colon cancer who is seen for assessment prior to initiation of cycle #3 Xeloda.  HPI:  The patient was seen last seen in the medical oncology clinic on 03/26/2018 by Honor Loh, NP.  At that time,  He was feeling overall.  Energy was back to baseline.  No treatment related side effects were noted.  He denied nausea, vomiting, or changes to his bowel habits.  No mucositis.  He denied neuropathy.  He complained of some minor joint pain and muscle aches in his shoulders bilaterally.  He was eating well; weight was up 3 pounds.  Exam was at baseline.  WBC was 5900 ([place] 3100).  Platelets were 152,000.  Sodium was 133 mmol/L.  Glucose was elevated 146 mg/dL.  CEA was stable at 2.1 ng/mL.  He began cycle #2 on 03/27/2018.  He contacted the clinic on 04/05/2018 regarding side effects of moderate nausea, joint aches, and 1 episode of watery diarrhea.  He denied any mouth sores or tenderness.  He denied hand foot syndrome.  He was unable to go to work x 2 days.  Xeloda was held x 2 days.  Xeloda was restarted at 1500 mg BID from 04/07/2018 - 04/09/2018.  During the interim, he states "I could be better".  He states that he feels "good" now.   He rates his general well being on a scale of 6-7 out of 10.  He notes decreased energy.  He denies any diarrhea, mouth sores or hand foot syndrome.  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   Past Medical History:  Diagnosis Date  . CKD (chronic kidney disease), stage II   . Colon cancer ([place])   . Diabetes mellitus without complication ([place])   . Hypertension     Past Surgical History:  Procedure Laterality Date  . COLON RESECTION SIGMOID N/A 12/31/2017   Procedure: COLON RESECTION SIGMOID;  Surgeon: Vickie Epley, MD;  Location: [facility] ORS;  Service: General;  Laterality: N/A;  . COLOSTOMY N/A 12/31/2017   Procedure: COLOSTOMY;  Surgeon: Vickie Epley, MD;  Location: [facility] ORS;  Service: General;  Laterality: N/A;    Family History  Adopted: Yes  Problem Relation Age of Onset  . Colon cancer Paternal Aunt     Social History:  reports that he has never smoked. He has never used smokeless tobacco. He reports current alcohol use. No history on file for drug.  Patient drinks beer, sometimes daily. No tobacco use. Patient denies known exposures to radiation on toxins. 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  He has a 19  year old daughter.  The patient's wife's name is [name].    Participants in the patient's visit included the patient and Vito Berger, CMA, today.  The intake visit was provided by Vito Berger, CMA.   Allergies: No Known Allergies  Current Medications: Current Outpatient Medications  Medication Sig Dispense Refill  . capecitabine (XELODA) 500 MG tablet TAKE 4 TABLETS BY MOUTH TWICE DAILY FOR 14 DAYS OF A 21 DAY CYCLE. 112 tablet 0  . lisinopril (PRINIVIL,ZESTRIL) 20 MG tablet Take 20 mg by mouth daily.    . metFORMIN (GLUCOPHAGE-XR) 500 MG 24 hr tablet Take 500 mg by mouth daily with breakfast.     . ondansetron (ZOFRAN-ODT) 4  MG disintegrating tablet Take 1 tablet (4 mg total) by mouth every 8 (eight) hours as needed for nausea or vomiting. 30 tablet 1  . oxyCODONE-acetaminophen (PERCOCET/ROXICET) 5-325 MG tablet Take 1 tablet by mouth every 4 (four) hours as needed for severe pain. 30 tablet 0   No current facility-administered medications for this visit.     Review of Systems  Constitutional: Negative for chills, diaphoresis, fever, malaise/fatigue (slight) and weight loss.       Feels "good".  On a scale from 1-10, "I feel like a 6-7".  HENT: Negative.  Negative for congestion, ear discharge, ear pain, nosebleeds, sinus pain and sore throat.   Eyes: Negative.  Negative for blurred vision, double vision, photophobia and pain.  Respiratory: Negative.  Negative for cough, hemoptysis, sputum production and shortness of breath.   Cardiovascular: Negative.  Negative for chest pain, palpitations, orthopnea, leg swelling and PND.  Gastrointestinal: Negative for abdominal pain, blood in stool, constipation, diarrhea, melena, nausea and vomiting.       Colostomy.  Genitourinary: Negative.  Negative for dysuria, frequency, hematuria and urgency.  Musculoskeletal: Negative for back pain, falls and neck pain.       Shoulders pain not felt secondary to work related activity.  Skin: Negative.  Negative for itching and rash.  Neurological: Negative.  Negative for dizziness, tremors, sensory change, focal weakness, weakness and headaches.  Endo/Heme/Allergies: Negative.  Does not bruise/bleed easily.  Psychiatric/Behavioral: Negative.  Negative for depression and memory loss. The patient is not nervous/anxious and does not have insomnia.   All other systems reviewed and are negative.  Performance status (ECOG): 1   Appointment on 04/16/2018  Component Date Value Ref Range Status  . Sodium 04/16/2018 134* 135 - 145 mmol/L Final  . Potassium 04/16/2018 4.2  3.5 - 5.1 mmol/L Final  . Chloride 04/16/2018 104  98 - 111 mmol/L  Final  . CO2 04/16/2018 23  22 - 32 mmol/L Final  . Glucose, Bld 04/16/2018 165* 70 - 99 mg/dL Final  . BUN 04/16/2018 18  6 - 20 mg/dL Final  . Creatinine, Ser 04/16/2018 1.10  0.61 - 1.24 mg/dL Final  . Calcium 04/16/2018 8.7* 8.9 - 10.3 mg/dL Final  . Total Protein 04/16/2018 8.2* 6.5 - 8.1 g/dL Final  . Albumin 04/16/2018 4.1  3.5 - 5.0 g/dL Final  . AST 04/16/2018 32  15 - 41 U/L Final  . ALT 04/16/2018 40  0 - 44 U/L Final  . Alkaline Phosphatase 04/16/2018 81  38 - 126 U/L Final  . Total Bilirubin 04/16/2018 1.2  0.3 - 1.2 mg/dL Final  . GFR calc non Af Amer 04/16/2018 >60  >60 mL/min Final  . GFR calc Af Amer 04/16/2018 >60  >60 mL/min Final  . Anion gap 04/16/2018 7  5 -  15 Final   Performed at [facility], [address], [place], [place] [address]  . WBC 04/16/2018 5.3  4.0 - 10.5 K/uL Final  . RBC 04/16/2018 4.86  4.22 - 5.81 MIL/uL Final  . Hemoglobin 04/16/2018 14.2  13.0 - 17.0 g/dL Final  . HCT 04/16/2018 41.4  39.0 - 52.0 % Final  . MCV 04/16/2018 85.2  80.0 - 100.0 fL Final  . MCH 04/16/2018 29.2  26.0 - 34.0 pg Final  . MCHC 04/16/2018 34.3  30.0 - 36.0 g/dL Final  . RDW 04/16/2018 16.8* 11.5 - 15.5 % Final  . Platelets 04/16/2018 145* 150 - 400 K/uL Final  . nRBC 04/16/2018 0.0  0.0 - 0.2 % Final  . Neutrophils Relative % 04/16/2018 60  % Final  . Neutro Abs 04/16/2018 3.1  1.7 - 7.7 K/uL Final  . Lymphocytes Relative 04/16/2018 30  % Final  . Lymphs Abs 04/16/2018 1.6  0.7 - 4.0 K/uL Final  . Monocytes Relative 04/16/2018 6  % Final  . Monocytes Absolute 04/16/2018 0.3  0.1 - 1.0 K/uL Final  . Eosinophils Relative 04/16/2018 3  % Final  . Eosinophils Absolute 04/16/2018 0.2  0.0 - 0.5 K/uL Final  . Basophils Relative 04/16/2018 1  % Final  . Basophils Absolute 04/16/2018 0.0  0.0 - 0.1 K/uL Final  . Immature Granulocytes 04/16/2018 0  % Final  . Abs Immature Granulocytes 04/16/2018 0.02  0.00 - 0.07 K/uL Final   Performed at [facility], [address], [place], [place] [address]    Assessment:  Adam Farrell is a 48 y.o. male with stage IIA sigmoid colon cancer s/p resection and ostomy on 12/31/2017.  Pathology revealed a 6.3 cm grade II sigmoid adenocarcinoma.  Tumor invaded the muscularis propria into the peri-colorectal tissue.  All margins were negative.  There was no lymphovascular invasion or perineural invasion.  There were no tumor deposits.  Zero of 20 lymph nodes were positive.  Greater omentum excision revealed diffuse acute peritonitis with edema and hemorrhage.  Pathologic stage was T3N0.  MMR/MSI was negative/intact.  He presented with rectal bleeding.  CEA was 2.3 on 01/18/2018.  Oncotype DX testing revealed a recurrence score of 15 which translated to a 12% (95% CI 9-17%) chance of recurrence within 3 years for a T3 tumor with surgery alone.  The risk of recurrence within 5 years of adjuvant 5FU/LV is 7%.  The risk of recurrence will likely decrease by 1% with the addition of oxaliplatin (risk 6%).  Colonoscopy on 12/23/2017 revealed fungating and infiltrative partially obstructing large mass 32 cm proximal to the anus.  The mass was 3.5 cm in length and circumferential.  The mass could not be passed with a pediatric scope.  Chest, abdomen and pelvic CT on 12/28/2017 revealed acute inflammatory changes and free fluid associated with the sigmoid colon in the mesentery, centered at the site of the sigmoid colon mass. The differential included complicating features of colonoscopy/biopsy, infectious/inflammatory colitis, or potentially a perforating colon cancer which pre-existed the recent colonoscopy.  There was circumferential thickening of the sigmoid colon, compatible with colon cancer. There were multiple small nodules/lymph nodes of the sigmoid mesentery which extended along the inferior mesenteric neurovascular drainage pathway. These may be pathologic with lymphatic extension versus reactive/inflammatory  changes.  CEA has been followed: 2.3 on 01/18/2018 and 2.0 on 03/05/2018.  He is s/p 2 cycles of  Xeloda (03/06/2018 - 03/27/2018).  Cycle #2 was notable for  holding Xeloda x 2 days and dose reduction (1500 mg BID) x 3 days secondary to side effects (inability to work, nausea, and joint aches).  Family history is limited.  He has an aunt with colon cancer at age 5.  [facility] genetic testing was negative.  Symptomatically, he feels fatigued.  He does not want to continue treatment.  Plan: 1. Labs today:  CBC with diff, CMP, CEA. 2. Stage IIA colon cancer Discuss symptoms associated with cycle #2.  Doubt shoulder achiness related to Xeloda.  No diarrhea, mouth sores or hand foot syndrome.  Patient tolerated 1500 mg BID at end of cycle #2 (after Xeloda was held). Discuss patient's thoughts about stopping therapy.  Patient encouraged to continue. Discuss starting next cycle at a lower dose and advancing as tolerated. Patient agreeable to starting cycle #3 at 1500 mg BID. If tolerated, cycle #4, advance to 2000 mg in AM and 1500 mg in PM If tolerated, cycle #5, Xeloda to full dose 2000 mg BID. Begin cycle #3 on 04/17/2018. 3. Shoulder tenderness and joint pain Xeloda related myalgia (</= 9%), arthralgia (8%), ostealgia (<5%).  Symptom management. 4. HYPOnatremia and hypocalcemia Sodium 134.  Calcium 8.7. Encourage fluids with electrolytes and calcium rich foods. Continue to monitor. 5.   Fatigue  Patient working full time.  Check B12, folate, TSH. 6.   RTC in 3 weeks for AM labs (CBC with diff, CMP). 7.   RTC in 3 weeks for telephone visit in the afternoon.   I discussed the assessment and treatment plan with the patient.  The patient was provided an opportunity to ask questions and all were answered.  The patient agreed with the plan and demonstrated an understanding of the instructions.  The patient was advised to call back or seek an in person evaluation if the symptoms worsen or  if the condition fails to improve as anticipated.  I provided 11 minutes (3:57 PM - 4:08 PM) of non-face-to-face time during this encounter.  I provided these services from the [facility] office.   Lequita Asal, MD, PhD  04/16/2018, 4:35 PM

## 2018-04-16 NOTE — Progress Notes (Signed)
Referring provider:  Sofie Hartigan, MD [place], [place] [address]  Virtual Visit via Telephone Note  I connected with [name] by telephone at their home on 04/16/18 at 10:30 AM EDT and verified that I was speaking with the correct person using their name and date of birth.   I discussed the limitations, risks, security and privacy concerns of performing an evaluation and management service by telephone and the availability of in person appointments. I also discussed with the patient that there may be a patient responsible charge related to this service. The patient expressed understanding and agreed to proceed.  History of Present Illness: 48 y.o. Male is being evaluated for post-op follow-up 3.5 months s/p open sigmoid colectomy with takedown of splenic flexure and creation of end-colostomy Adam Farrell, 12/31/2017) forhis near-completely obstructinginvasive adenocarcinoma of the sigmoid colonand multiple pelvic and peritoneal abscesses with sigmoid colitisandlikely microperforation following colonoscopy Adam Farrell, 12/11) for bright red blood per rectum. Patient states he was anxious about having a colostomy, but that his colostomy has "worked out better" than he though it would and that he'd "rather have a colostomy than take chemotherapy medication", though expresses understanding that one has nothing to do with the other. Patient reports he felt well following his first cycle of Xeloda for oral adjuvant chemotherapy, but he described significant fatigue and nausea after his more recent second cycle. He adds that he recently discussed with Dr. Mike Gip reduction of his dose and will discuss with her soon starting his third cycle of Xeloda. He otherwise reports he has been tolerating regular diet with routine colostomy function, though sometimes feces from colostomy are "firm". He denies N/V, fever/chills, CP, or SOB and describes his incision(s) as well-approximated without any  surrounding redness or any associated drainage, purulent or otherwise. Patient lastly inquires whether or not it is okay to delay his anticipated colostomy reversal surgery until after a car show in [place] that has been rescheduled for June, 2020 due to Covid-19 coronavirus.  Review of Systems:  Constitutional: denies fever/chills  Respiratory: denies shortness of breath, wheezing  Cardiovascular: denies chest pain, palpitations  Gastrointestinal: abdominal pain, N/V, and bowel function as per interval history Skin: Denies any other rashes or skin discolorations except post-surgical wounds as per interval history  Laboratory studies:  CBC Latest Ref Rng & Units 04/16/2018 03/26/2018 03/19/2018  WBC 4.0 - 10.5 K/uL 5.3 5.9 6.3  Hemoglobin 13.0 - 17.0 g/dL 14.2 13.8 14.9  Hematocrit 39.0 - 52.0 % 41.4 39.8 43.7  Platelets 150 - 400 K/uL 145(L) 152 190   CMP Latest Ref Rng & Units 04/16/2018 03/26/2018 03/19/2018  Glucose 70 - 99 mg/dL 165(H) 146(H) 154(H)  BUN 6 - 20 mg/dL 18 18 18   Creatinine 0.61 - 1.24 mg/dL 1.10 1.04 1.15  Sodium 135 - 145 mmol/L 134(L) 133(L) 134(L)  Potassium 3.5 - 5.1 mmol/L 4.2 4.0 4.2  Chloride 98 - 111 mmol/L 104 101 101  CO2 22 - 32 mmol/L 23 27 27   Calcium 8.9 - 10.3 mg/dL 8.7(L) 8.9 9.2  Total Protein 6.5 - 8.1 g/dL 8.2(H) 8.1 8.9(H)  Total Bilirubin 0.3 - 1.2 mg/dL 1.2 0.5 0.6  Alkaline Phos 38 - 126 U/L 81 77 79  AST 15 - 41 U/L 32 21 18  ALT 0 - 44 U/L 40 25 22   Imaging: No new pertinent imaging available for review  Assessment:  48 y.o. yo Male with a problem list including...  Patient Active Problem List   Diagnosis  Date Noted  . Normocytic anemia 01/25/2018  . Goals of care, counseling/discussion 01/23/2018  . Other partial intestinal obstruction ([place])   . Colon cancer ([place]) 01/13/2017  . Mass of left hand 08/01/2016  . Hypercholesterolemia 07/03/2016  . Uncontrolled type 2 diabetes mellitus without complication, without long-term current use of  insulin ([place]) 01/02/2016  . Essential hypertension 01/02/2016    doing well at time of current post-surgical encounter/evaluation 3.5 months s/p open sigmoid colectomy with takedown of the splenic flexure and creation of end-colostomy Adam Farrell, 12/31/2017) fornearly obstructinginvasive adenocarcinoma of the sigmoid colonand multiple pelvic and peritoneal abscesses alongwith sigmoid colitisandlikely with microperforation following recent colonoscopy Adam Farrell, 12/11) for bright red blood per rectum.  Follow-up Instructions / Plan:               - regular high-fiber heart healthy diet  - chemotherapy regimen as per patient and Dr. Mike Gip  - discussed with patient importance of hydration, fiber, and +/- once daily Colace if constipation             - okay to continue submerging incisions under water (baths, swimming) prn             - continue all activities without restrictions, good nutrition and exercise encouraged  - informed that all elective surgeries are currently being postponed due to Covid-19 coronavirus pandemic, including his pending colonoscopy and colostomy reversal, to which patient expressed preference to delay further until after upcoming Adam Farrell car show, rescheduled tentatively to June, 2020             - patient advised he will be contacted to schedule a subsequent appointment/exam when such routine follow-up appointments may safely be performed, and patient expresses agreement and satisfaction with this             - instructed to call office if any questions or concerns  All of the above recommendations were discussed with the patient, and all of patient's questions were answered to his expressed satisfaction.  The patient was advised to call back or seek an in-person evaluation if the symptoms worsen or if the condition fails to improve as anticipated.  From [facility] outpatient surgery office, I provided 20 minutes of non-face-to-face time during this  encounter.  -- [name] Adam Hoes, MD, [place]: [facility] - Partnering for exceptional care. Office: [phone]

## 2018-04-16 NOTE — Progress Notes (Signed)
No new changes noted today. The patient DOB, Name and DOB has been verified with tele visit today.

## 2018-04-17 LAB — CEA: CEA: 2.5 ng/mL (ref 0.0–4.7)

## 2018-05-06 ENCOUNTER — Telehealth: Payer: Self-pay | Admitting: Hematology and Oncology

## 2018-05-07 ENCOUNTER — Other Ambulatory Visit: Payer: Self-pay

## 2018-05-07 ENCOUNTER — Encounter: Payer: Self-pay | Admitting: Hematology and Oncology

## 2018-05-07 ENCOUNTER — Inpatient Hospital Stay: Payer: BLUE CROSS/BLUE SHIELD

## 2018-05-07 ENCOUNTER — Inpatient Hospital Stay (HOSPITAL_BASED_OUTPATIENT_CLINIC_OR_DEPARTMENT_OTHER): Payer: BLUE CROSS/BLUE SHIELD | Admitting: Hematology and Oncology

## 2018-05-07 DIAGNOSIS — Z7984 Long term (current) use of oral hypoglycemic drugs: Secondary | ICD-10-CM

## 2018-05-07 DIAGNOSIS — C187 Malignant neoplasm of sigmoid colon: Secondary | ICD-10-CM | POA: Diagnosis not present

## 2018-05-07 DIAGNOSIS — I10 Essential (primary) hypertension: Secondary | ICD-10-CM

## 2018-05-07 DIAGNOSIS — E871 Hypo-osmolality and hyponatremia: Secondary | ICD-10-CM | POA: Diagnosis not present

## 2018-05-07 DIAGNOSIS — E119 Type 2 diabetes mellitus without complications: Secondary | ICD-10-CM | POA: Diagnosis not present

## 2018-05-07 DIAGNOSIS — R5383 Other fatigue: Secondary | ICD-10-CM | POA: Diagnosis not present

## 2018-05-07 DIAGNOSIS — Z79899 Other long term (current) drug therapy: Secondary | ICD-10-CM

## 2018-05-07 LAB — CBC WITH DIFFERENTIAL/PLATELET
Abs Immature Granulocytes: 0.02 10*3/uL (ref 0.00–0.07)
Basophils Absolute: 0 10*3/uL (ref 0.0–0.1)
Basophils Relative: 1 %
Eosinophils Absolute: 0.2 10*3/uL (ref 0.0–0.5)
Eosinophils Relative: 4 %
HCT: 41.5 % (ref 39.0–52.0)
Hemoglobin: 14.3 g/dL (ref 13.0–17.0)
Immature Granulocytes: 0 %
Lymphocytes Relative: 35 %
Lymphs Abs: 1.9 10*3/uL (ref 0.7–4.0)
MCH: 30.3 pg (ref 26.0–34.0)
MCHC: 34.5 g/dL (ref 30.0–36.0)
MCV: 87.9 fL (ref 80.0–100.0)
Monocytes Absolute: 0.3 10*3/uL (ref 0.1–1.0)
Monocytes Relative: 6 %
Neutro Abs: 2.9 10*3/uL (ref 1.7–7.7)
Neutrophils Relative %: 54 %
Platelets: 138 10*3/uL — ABNORMAL LOW (ref 150–400)
RBC: 4.72 MIL/uL (ref 4.22–5.81)
RDW: 16.9 % — ABNORMAL HIGH (ref 11.5–15.5)
WBC: 5.4 10*3/uL (ref 4.0–10.5)
nRBC: 0 % (ref 0.0–0.2)

## 2018-05-07 LAB — COMPREHENSIVE METABOLIC PANEL
ALT: 36 U/L (ref 0–44)
AST: 27 U/L (ref 15–41)
Albumin: 4 g/dL (ref 3.5–5.0)
Alkaline Phosphatase: 78 U/L (ref 38–126)
Anion gap: 5 (ref 5–15)
BUN: 17 mg/dL (ref 6–20)
CO2: 25 mmol/L (ref 22–32)
Calcium: 8.7 mg/dL — ABNORMAL LOW (ref 8.9–10.3)
Chloride: 102 mmol/L (ref 98–111)
Creatinine, Ser: 1.13 mg/dL (ref 0.61–1.24)
GFR calc Af Amer: >60 mL/min (ref >60)
GFR calc non Af Amer: >60 mL/min (ref >60)
Glucose, Bld: 242 mg/dL — ABNORMAL HIGH (ref 70–99)
Potassium: 4.7 mmol/L (ref 3.5–5.1)
Sodium: 132 mmol/L — ABNORMAL LOW (ref 135–145)
Total Bilirubin: 0.9 mg/dL (ref 0.3–1.2)
Total Protein: 8 g/dL (ref 6.5–8.1)

## 2018-05-07 LAB — VITAMIN B12: Vitamin B-12: 336 pg/mL (ref 180–914)

## 2018-05-07 NOTE — Progress Notes (Signed)
[facility]  [address], [address] [place], [place] [address] Phone: [phone]  Fax: [phone]   Telephone Visit:  05/07/2018  Referring physician: Sofie Hartigan, MD  I connected with Adam Farrell on 05/07/2018 at 3:36 PM EDT by telephone and verified that I was speaking with the correct person using 2 identifiers.  The patient was at work.  I discussed the limitations, risk, security and privacy concerns of performing an evaluation and management service by telephone and  the availability of in person appointments.  I also discussed with the patient that there may be a patient responsible charge related to this service.  The patient expressed understanding and agreed to proceed.   Chief Complaint: Adam Farrell is a 48 y.o. male with stage IIA sigmoid colon cancer who is seen for assessment prior to initiation of cycle #4 Xeloda.  HPI:  The patient was seen last seen in the medical oncology clinic on 04/16/2018.  At that time, he was fatigued and did not want to continue treatment. WBC was 5300 ([place] 3100).  Platelets were 145,000.  Sodium was 134 mmol/L.  Glucose was elevated 165 mg/dL.  CEA was stable at 2.5 ng/mL.  He began cycle #3 on 04/17/2018, taking 3 pills daily with 2 weeks on and 1 week off.   Symptomatically, today he states he "feels the same." He has had stable side effects and fatigue has continued. He has occasional diarrhea. He denies mouth sores, tenderness on hands or feet.  He is concerned about being in the hospital with the COVID-19 pandemic and would prefer to undergo surgery this summer in July for reversal of his colostomy.   He will begin cycle #4 tomorrow, 05/08/2018, with 4 pills in the morning and 3 at night.    Past Medical History:  Diagnosis Date  . CKD (chronic kidney disease), stage II   . Colon cancer ([place])   . Diabetes mellitus without complication ([place])   . Hypertension     Past Surgical History:   Procedure Laterality Date  . COLON RESECTION SIGMOID N/A 12/31/2017   Procedure: COLON RESECTION SIGMOID;  Surgeon: Vickie Epley, MD;  Location: [facility] ORS;  Service: General;  Laterality: N/A;  . COLOSTOMY N/A 12/31/2017   Procedure: COLOSTOMY;  Surgeon: Vickie Epley, MD;  Location: [facility] ORS;  Service: General;  Laterality: N/A;    Family History  Adopted: Yes  Problem Relation Age of Onset  . Colon cancer Paternal Aunt     Social History:  reports that he has never smoked. He has never used smokeless tobacco. He reports current alcohol use. No history on file for drug.  Patient drinks beer, sometimes daily. No tobacco use. Patient denies known exposures to radiation on toxins. He is employed full time as a Copywriter, advertising". He has a 74 year old daughter.  The patient's wife's name is [name].  The patient is alone today.   Participants in the patient's visit and their role in the encounter included the patient and Waymon Budge, RN today.  The intake visit was provided by Waymon Budge, RN.   Allergies: No Known Allergies  Current Medications: Current Outpatient Medications  Medication Sig Dispense Refill  . capecitabine (XELODA) 500 MG tablet TAKE 4 TABLETS BY MOUTH TWICE DAILY FOR 14 DAYS OF A 21 Adam CYCLE. 112 tablet 0  . lisinopril (PRINIVIL,ZESTRIL) 20 MG tablet Take 20 mg by mouth daily.    . metFORMIN (GLUCOPHAGE-XR) 500 MG 24  hr tablet Take 500 mg by mouth daily with breakfast.     . ondansetron (ZOFRAN-ODT) 4 MG disintegrating tablet Take 1 tablet (4 mg total) by mouth every 8 (eight) hours as needed for nausea or vomiting. 30 tablet 1  . oxyCODONE-acetaminophen (PERCOCET/ROXICET) 5-325 MG tablet Take 1 tablet by mouth every 4 (four) hours as needed for severe pain. (Patient not taking: Reported on 04/16/2018) 30 tablet 0   No current facility-administered medications for this visit.     Review of Systems  Constitutional: Positive for malaise/fatigue (slight).  Negative for chills, diaphoresis, fever and weight loss.       Feels "the same".   HENT: Negative.  Negative for congestion, ear pain, nosebleeds, sinus pain and sore throat.        Denies mouth sores.   Eyes: Negative.  Negative for blurred vision, double vision, photophobia and pain.  Respiratory: Negative.  Negative for cough, hemoptysis, sputum production and shortness of breath.   Cardiovascular: Negative.  Negative for chest pain, palpitations, orthopnea, leg swelling and PND.  Gastrointestinal: Positive for diarrhea (occasional- food related). Negative for abdominal pain, blood in stool, constipation, melena, nausea and vomiting.       Colostomy.  Genitourinary: Negative.  Negative for dysuria, frequency, hematuria and urgency.  Musculoskeletal: Positive for joint pain (shoulder). Negative for back pain, falls, myalgias and neck pain.  Skin: Negative.  Negative for itching and rash.       Denies peeling or redness on hands or feet.  Neurological: Negative.  Negative for dizziness, tremors, sensory change, focal weakness, weakness and headaches.       Denies numbness in hands or feet.  Endo/Heme/Allergies: Negative.  Does not bruise/bleed easily.  Psychiatric/Behavioral: Negative.  Negative for depression and memory loss. The patient is not nervous/anxious and does not have insomnia.   All other systems reviewed and are negative.  Performance status (ECOG): 1   Appointment on 05/07/2018  Component Date Value Ref Range Status  . Sodium 05/07/2018 132* 135 - 145 mmol/L Final  . Potassium 05/07/2018 4.7  3.5 - 5.1 mmol/L Final  . Chloride 05/07/2018 102  98 - 111 mmol/L Final  . CO2 05/07/2018 25  22 - 32 mmol/L Final  . Glucose, Bld 05/07/2018 242* 70 - 99 mg/dL Final  . BUN 05/07/2018 17  6 - 20 mg/dL Final  . Creatinine, Ser 05/07/2018 1.13  0.61 - 1.24 mg/dL Final  . Calcium 05/07/2018 8.7* 8.9 - 10.3 mg/dL Final  . Total Protein 05/07/2018 8.0  6.5 - 8.1 g/dL Final  . Albumin  05/07/2018 4.0  3.5 - 5.0 g/dL Final  . AST 05/07/2018 27  15 - 41 U/L Final  . ALT 05/07/2018 36  0 - 44 U/L Final  . Alkaline Phosphatase 05/07/2018 78  38 - 126 U/L Final  . Total Bilirubin 05/07/2018 0.9  0.3 - 1.2 mg/dL Final  . GFR calc non Af Amer 05/07/2018 >60  >60 mL/min Final  . GFR calc Af Amer 05/07/2018 >60  >60 mL/min Final  . Anion gap 05/07/2018 5  5 - 15 Final   Performed at [facility], [address] [place], [place] [address]  . WBC 05/07/2018 5.4  4.0 - 10.5 K/uL Final  . RBC 05/07/2018 4.72  4.22 - 5.81 MIL/uL Final  . Hemoglobin 05/07/2018 14.3  13.0 - 17.0 g/dL Final  . HCT 05/07/2018 41.5  39.0 - 52.0 % Final  . MCV 05/07/2018 87.9  80.0 - 100.0  fL Final  . MCH 05/07/2018 30.3  26.0 - 34.0 pg Final  . MCHC 05/07/2018 34.5  30.0 - 36.0 g/dL Final  . RDW 05/07/2018 16.9* 11.5 - 15.5 % Final  . Platelets 05/07/2018 138* 150 - 400 K/uL Final   Comment: Immature Platelet Fraction may be clinically indicated, consider ordering this additional test [id number]   . nRBC 05/07/2018 0.0  0.0 - 0.2 % Final  . Neutrophils Relative % 05/07/2018 54  % Final  . Neutro Abs 05/07/2018 2.9  1.7 - 7.7 K/uL Final  . Lymphocytes Relative 05/07/2018 35  % Final  . Lymphs Abs 05/07/2018 1.9  0.7 - 4.0 K/uL Final  . Monocytes Relative 05/07/2018 6  % Final  . Monocytes Absolute 05/07/2018 0.3  0.1 - 1.0 K/uL Final  . Eosinophils Relative 05/07/2018 4  % Final  . Eosinophils Absolute 05/07/2018 0.2  0.0 - 0.5 K/uL Final  . Basophils Relative 05/07/2018 1  % Final  . Basophils Absolute 05/07/2018 0.0  0.0 - 0.1 K/uL Final  . Immature Granulocytes 05/07/2018 0  % Final  . Abs Immature Granulocytes 05/07/2018 0.02  0.00 - 0.07 K/uL Final   Performed at [facility], [address] [place], [place] [address]    Assessment:  Adam Farrell is a 48 y.o. male with stage IIA sigmoid colon cancer s/p resection and ostomy on 12/31/2017.  Pathology revealed a  6.3 cm grade II sigmoid adenocarcinoma.  Tumor invaded the muscularis propria into the peri-colorectal tissue.  All margins were negative.  There was no lymphovascular invasion or perineural invasion.  There were no tumor deposits.  Zero of 20 lymph nodes were positive.  Greater omentum excision revealed diffuse acute peritonitis with edema and hemorrhage.  Pathologic stage was T3N0.  MMR/MSI was negative/intact.  He presented with rectal bleeding.  CEA was 2.3 on 01/18/2018.  Oncotype DX testing revealed a recurrence score of 15 which translated to a 12% (95% CI 9-17%) chance of recurrence within 3 years for a T3 tumor with surgery alone.  The risk of recurrence within 5 years of adjuvant 5FU/LV is 7%.  The risk of recurrence will likely decrease by 1% with the addition of oxaliplatin (risk 6%).  Colonoscopy on 12/23/2017 revealed fungating and infiltrative partially obstructing large mass 32 cm proximal to the anus.  The mass was 3.5 cm in length and circumferential.  The mass could not be passed with a pediatric scope.  Chest, abdomen and pelvic CT on 12/28/2017 revealed acute inflammatory changes and free fluid associated with the sigmoid colon in the mesentery, centered at the site of the sigmoid colon mass. The differential included complicating features of colonoscopy/biopsy, infectious/inflammatory colitis, or potentially a perforating colon cancer which pre-existed the recent colonoscopy.  There was circumferential thickening of the sigmoid colon, compatible with colon cancer. There were multiple small nodules/lymph nodes of the sigmoid mesentery which extended along the inferior mesenteric neurovascular drainage pathway. These may be pathologic with lymphatic extension versus reactive/inflammatory changes.  CEA has been followed: 2.3 on 01/18/2018, 2.0 on 03/05/2018, and 2.5 on 04/16/2018.  He is s/p 3 cycles of  Xeloda (03/06/2018 - 04/17/2018).   Family history is limited.  He has an aunt  with colon cancer at age 29.  [facility] genetic testing was negative.  Symptomatically, he is doing well.  He notes some fatigue and rare diarrhea which may be related to food choices.  Plan: 1. Labs today:  CBC with diff, CMP, B12. 2.  Stage IIA colon cancer Discuss symptoms associated with cycle #3.  Patient denies any mouth sores or hand foot syndrome.  Rare diarrhea appears related to food choices.  Patient tolerated 1500 mg BID 2 weeks on/1 week off.  Side effects similar to full dose Xeloda.  Discuss increasing Xeloda by 1 pill (2000 mg AM and 1500 mg PM).  If tolerated, discuss increasing Xeloda to 2000 mg po BID with next cycle. Begin cycle #4 on 05/08/2018. 3. Shoulder tenderness and joint pain Suspect related to work tasks. Xeloda related myalgia (</= 9%), arthralgia (8%), ostealgia (<5%).  Symptom management. 4. HYPOnatremia and hypocalcemia Sodium 132.  Calcium 8.7. Encourage fluids with electrolytes and calcium rich foods. Continue to monitor. 5.   Fatigue  Etiology likely related to working full time + oral chemotherapy.  B12 336 today.  B12 goal 400.  Folate 19.9 (normal).  TSH 2.011 (normal). 6.   RTC in 3 weeks for MD assessment (Doximitry) and labs (CBC with diff, CMP).   I discussed the assessment and treatment plan with the patient.  The patient was provided an opportunity to ask questions and all were answered.  The patient agreed with the plan and demonstrated an understanding of the instructions.  The patient was advised to call back or seek an in person evaluation if the symptoms worsen or if the condition fails to improve as anticipated.  I provided 17 minutes (3:53 PM - 4:10 PM) of non-face-to-face time during this encounter.  I provided these services from the [facility] office.   Lequita Asal, MD, PhD  04/16/2018, 4:35 PM  I, Molly Dorshimer, am acting as Education administrator for [employer]. Mike Gip, MD, PhD.  I, Melissa C. Mike Gip, MD, have reviewed the above  documentation for accuracy and completeness, and I agree with the above.

## 2018-05-07 NOTE — Progress Notes (Signed)
Confirmed name, DOB, and address. Denies any concerns at this time.  

## 2018-05-10 ENCOUNTER — Telehealth: Payer: Self-pay

## 2018-05-10 NOTE — Telephone Encounter (Signed)
-----   Message from Lequita Asal, MD sent at 05/09/2018  5:20 AM EDT ----- Regarding: Please call patient  B12 level is a little low.  Begin oral B12 1000 mcg a day.  M ----- Message ----- From: Buel Ream, Lab In [place] Sent: 05/07/2018   8:25 AM EDT To: Lequita Asal, MD

## 2018-05-10 NOTE — Telephone Encounter (Signed)
VM left on home and cell requesting patient to call back. (This is regarding low B12 levels and initiation of Vitamin B12 1000 MCG daily).

## 2018-05-11 ENCOUNTER — Telehealth: Payer: Self-pay

## 2018-05-11 NOTE — Telephone Encounter (Signed)
-----   Message from Lequita Asal, MD sent at 05/09/2018  5:20 AM EDT ----- Regarding: Please call patient  B12 level is a little low.  Begin oral B12 1000 mcg a day.  M ----- Message ----- From: Buel Ream, Lab In [place] Sent: 05/07/2018   8:25 AM EDT To: Lequita Asal, MD

## 2018-05-11 NOTE — Telephone Encounter (Signed)
Spoke with patient and informed him of low B12 levels. Advised Dr. Mike Gip would like for him to start taking Vitamin B12 1,000 MCG Daily. Patient verbalizes understanding.  Patient is c/o bilateral shoulder pain. This has been a chronic pain that has been manageable with Tylenol and Motrin per patient. Informed Dr. Mike Gip and she suggested reaching out to patient's PCP Adam Farrell) to have an ortho consult.   Contacted [facility] office and message was sent from receptionist to nurse regarding ortho consult.   Informed patient of information above. No further questions.

## 2018-05-23 DIAGNOSIS — E871 Hypo-osmolality and hyponatremia: Secondary | ICD-10-CM | POA: Insufficient documentation

## 2018-05-23 DIAGNOSIS — R5383 Other fatigue: Secondary | ICD-10-CM | POA: Insufficient documentation

## 2018-05-23 NOTE — Progress Notes (Signed)
[facility]  [address], [address] [place], [place] [address] Phone: [phone]  Fax: [phone]   Telemedicine Office Visit:  05/24/2018  Referring physician: Sofie Hartigan, MD  I connected with Glendell Docker on 05/24/2018 at 3:41 PM by videoconferencing and verified that I was speaking with the correct person using 2 identifiers.  The patient was at home.  I discussed the limitations, risk, security and privacy concerns of performing an evaluation and management service by videoconferencing and the availability of in person appointments.  I also discussed with the patient that there may be a patient responsible charge related to this service.  The patient expressed understanding and agreed to proceed.   Chief Complaint: Adam Farrell is a 48 y.o. male with stage IIA sigmoid colon cancer who is seen for assessment prior to initiation of cycle #5 Xeloda.  HPI: The patient was last seen in the oncology clinic on 05/07/2018. At that time, he was doing well.  He noted some fatigue and rare diarrhea which may be related to food choices. B12 level was 336.  Cycle #4 Xeloda began on 05/08/2018.  He began oral 1074mg B12 supplements on 05/11/2018.  At that time he complained of chronic bilateral shoulder pain and was told to seek an ortho referral from his PCP.  During the interim, he has felt "better".  He states that he went to the orthopedic surgeon, Dr TKarren Cobble who gave him a "shot in his left shoulder". He has a little more energy on oral B12.  He states he missed 2 days of work this week secondary to his shoulder.  He was off  work for 2 days.    He comments that he has he took 3 Xeloda pills in the morning and 4 pills in the evening with his past cycle.  He denies any change in symptoms with the higher dose.  Denies any mouth sores or tenderness.  He denies any diarrhea.  He had tenderness in 1-2 fingers which may be work-related.  He denies any palmar or  plantar desquamation.  Next cycle is to begin on Saturday 05/25/2018.   Past Medical History:  Diagnosis Date  . CKD (chronic kidney disease), stage II   . [place] cancer ([place]   . Diabetes mellitus without complication ([place]   . Hypertension     Past Surgical History:  Procedure Laterality Date  . COLON RESECTION SIGMOID N/A 12/31/2017   Procedure: COLON RESECTION SIGMOID;  Surgeon: DVickie Epley MD;  Location: [facility] ORS;  Service: General;  Laterality: N/A;  . COLOSTOMY N/A 12/31/2017   Procedure: COLOSTOMY;  Surgeon: DVickie Epley MD;  Location: [facility] ORS;  Service: General;  Laterality: N/A;    Family History  Adopted: Yes  Problem Relation Age of Onset  . Colon cancer Paternal Aunt     Social History:  reports that he has never smoked. He has never used smokeless tobacco. He reports current alcohol use. No history on file for drug.  Patient drinks beer, sometimes daily. No tobacco use. Patient denies known exposures to radiation on toxins. He is employed full time as a "Copywriter, advertising. He states that he needs to return to work by 02/15/2018.  He has a 187year old daughter.  [place]  Participants in the patient's visit and their role in the encounter included the patient and CWaymon Budge RN today.  The intake visit was provided by CWaymon Budge RN.  Allergies: No  Known Allergies  Current Medications: Current Outpatient Medications  Medication Sig Dispense Refill  . lisinopril (PRINIVIL,ZESTRIL) 20 MG tablet Take 20 mg by mouth daily.    . metFORMIN (GLUCOPHAGE-XR) 500 MG 24 hr tablet Take 500 mg by mouth daily with breakfast.     . ondansetron (ZOFRAN-ODT) 4 MG disintegrating tablet Take 1 tablet (4 mg total) by mouth every 8 (eight) hours as needed for nausea or vomiting. 30 tablet 1  . oxyCODONE-acetaminophen (PERCOCET/ROXICET) 5-325 MG tablet Take 1 tablet by mouth every 4 (four) hours as needed for severe pain. 30 tablet 0  . vitamin B-12  (CYANOCOBALAMIN) 1000 MCG tablet Take 1,000 mcg by mouth daily.    . capecitabine (XELODA) 500 MG tablet TAKE 4 TABLETS BY MOUTH TWICE DAILY FOR 14 DAYS OF A 21 DAY CYCLE. (Patient not taking: Reported on 05/07/2018) 112 tablet 0   No current facility-administered medications for this visit.     Review of Systems  Constitutional: Negative.  Negative for chills, diaphoresis, fever, malaise/fatigue and weight loss.       Feels "better".   HENT: Negative.  Negative for congestion, ear pain, nosebleeds, sinus pain and sore throat.        Denies mouth sores.   Eyes: Negative.  Negative for blurred vision, double vision, photophobia and pain.  Respiratory: Negative.  Negative for cough, hemoptysis, sputum production and shortness of breath.   Cardiovascular: Negative.  Negative for chest pain, palpitations, orthopnea, leg swelling and PND.  Gastrointestinal: Negative for abdominal pain, blood in stool, constipation, diarrhea, melena, nausea and vomiting.       Colostomy.  Genitourinary: Negative.  Negative for dysuria, frequency, hematuria and urgency.  Musculoskeletal: Positive for joint pain (left shoulder s/p injection). Negative for back pain, falls, myalgias and neck pain.  Skin: Negative.  Negative for itching and rash.       Denies any desquamation of hands or feet.  Neurological: Negative.  Negative for dizziness, tingling, tremors, sensory change, speech change, focal weakness, weakness and headaches.       Denies numbness in hands or feet.  Endo/Heme/Allergies: Negative.  Does not bruise/bleed easily.  Psychiatric/Behavioral: Negative.  Negative for depression and memory loss. The patient is not nervous/anxious and does not have insomnia.   All other systems reviewed and are negative.   Performance status (ECOG):  1  Physical Exam  Constitutional: He is oriented to person, place, and time. He appears well-developed and well-nourished. No distress.  HENT:  Graying hair.  Eyes:  Conjunctivae and EOM are normal. No scleral icterus.  Blue eyes.  Neurological: He is alert and oriented to person, place, and time.  Skin: He is not diaphoretic.  Psychiatric: He has a normal mood and affect. His behavior is normal. Judgment and thought content normal.  Nursing note reviewed.   Appointment on 05/24/2018  Component Date Value Ref Range Status  . Sodium 05/24/2018 134* 135 - 145 mmol/L Final  . Potassium 05/24/2018 4.4  3.5 - 5.1 mmol/L Final  . Chloride 05/24/2018 102  98 - 111 mmol/L Final  . CO2 05/24/2018 25  22 - 32 mmol/L Final  . Glucose, Bld 05/24/2018 211* 70 - 99 mg/dL Final  . BUN 05/24/2018 12  6 - 20 mg/dL Final  . Creatinine, Ser 05/24/2018 1.21  0.61 - 1.24 mg/dL Final  . Calcium 05/24/2018 8.8* 8.9 - 10.3 mg/dL Final  . Total Protein 05/24/2018 7.9  6.5 - 8.1 g/dL Final  . Albumin 05/24/2018 4.1  3.5 -  5.0 g/dL Final  . AST 05/24/2018 35  15 - 41 U/L Final  . ALT 05/24/2018 44  0 - 44 U/L Final  . Alkaline Phosphatase 05/24/2018 85  38 - 126 U/L Final  . Total Bilirubin 05/24/2018 0.7  0.3 - 1.2 mg/dL Final  . GFR calc non Af Amer 05/24/2018 >60  >60 mL/min Final  . GFR calc Af Amer 05/24/2018 >60  >60 mL/min Final  . Anion gap 05/24/2018 7  5 - 15 Final   Performed at [facility], [address] [place], [place] [address]  . WBC 05/24/2018 5.4  4.0 - 10.5 K/uL Final  . RBC 05/24/2018 4.40  4.22 - 5.81 MIL/uL Final  . Hemoglobin 05/24/2018 13.7  13.0 - 17.0 g/dL Final  . HCT 05/24/2018 39.1  39.0 - 52.0 % Final  . MCV 05/24/2018 88.9  80.0 - 100.0 fL Final  . MCH 05/24/2018 31.1  26.0 - 34.0 pg Final  . MCHC 05/24/2018 35.0  30.0 - 36.0 g/dL Final  . RDW 05/24/2018 16.6* 11.5 - 15.5 % Final  . Platelets 05/24/2018 146* 150 - 400 K/uL Final  . nRBC 05/24/2018 0.0  0.0 - 0.2 % Final  . Neutrophils Relative % 05/24/2018 55  % Final  . Neutro Abs 05/24/2018 3.0  1.7 - 7.7 K/uL Final  . Lymphocytes Relative 05/24/2018 34  % Final  .  Lymphs Abs 05/24/2018 1.8  0.7 - 4.0 K/uL Final  . Monocytes Relative 05/24/2018 7  % Final  . Monocytes Absolute 05/24/2018 0.4  0.1 - 1.0 K/uL Final  . Eosinophils Relative 05/24/2018 3  % Final  . Eosinophils Absolute 05/24/2018 0.2  0.0 - 0.5 K/uL Final  . Basophils Relative 05/24/2018 1  % Final  . Basophils Absolute 05/24/2018 0.0  0.0 - 0.1 K/uL Final  . Immature Granulocytes 05/24/2018 0  % Final  . Abs Immature Granulocytes 05/24/2018 0.01  0.00 - 0.07 K/uL Final   Performed at [facility], [address], [place], [place] [address]    Assessment:  MOLLY MASELLI is a 48 y.o. male with stage IIA sigmoid colon cancer s/p resection and ostomy on 12/31/2017.  Pathology revealed a 6.3 cm grade II sigmoid adenocarcinoma.  Tumor invaded the muscularis propria into the peri-colorectal tissue.  All margins were negative.  There was no lymphovascular invasion or perineural invasion.  There were no tumor deposits.  Zero of 20 lymph nodes were positive.  Greater omentum excision revealed diffuse acute peritonitis with edema and hemorrhage.  Pathologic stage was T3N0.  MMR/MSI was negative/intact.  He presented with rectal bleeding.  CEA was 2.3 on 01/18/2018.  Oncotype DX testing revealed a recurrence score of 15 which translated to a 12% (95% CI 9-17%) chance of recurrence within 3 years for a T3 tumor with surgery alone.  The risk of recurrence within 5 years of adjuvant 5FU/LV is 7%.  The risk of recurrence will likely decrease by 1% with the addition of oxaliplatin (risk 6%).  Colonoscopy on 12/23/2017 revealed fungating and infiltrative partially obstructing large mass 32 cm proximal to the anus.  The mass was 3.5 cm in length and circumferential.  The mass could not be passed with a pediatric scope.  Chest, abdomen and pelvic CT on 12/28/2017 revealed acute inflammatory changes and free fluid associated with the sigmoid colon in the mesentery, centered at the site of the  sigmoid colon mass. The differential included complicating features of colonoscopy/biopsy, infectious/inflammatory colitis,  or potentially a perforating colon cancer which pre-existed the recent colonoscopy.  There was circumferential thickening of the sigmoid colon, compatible with colon cancer. There were multiple small nodules/lymph nodes of the sigmoid mesentery which extended along the inferior mesenteric neurovascular drainage pathway. These may be pathologic with lymphatic extension versus reactive/inflammatory changes.  CEA has been followed: 2.3 on 01/18/2018, 2.0 on 03/05/2018, and 2.5 on 04/16/2018.  He is s/p 4 cycles of  Xeloda (03/06/2018 - 05/08/2018).   Family history is limited.  He has an aunt with colon cancer at age 17.  [facility] genetic testing was negative.  Symptomatically, he is doing well.  He is tolerating Xeloda.  Plan: 1. Labs today:  CBC with diff, CMP. 2. Stage IIA colon cancer Clinically he is doing well. He tolerated cycle #4 at the higher dose of Xeloda (1500 mg AM and 2000 mg PM) Discuss patients thoughts about increasing to full dose Xeloda as he did not experience any increased side effects.  Patient is agreeable. Discuss patient's work schedule and availability for getting labs prior to start of each cycle.  Discuss [place] schedule.  Discuss plan for next cycle to start on Monday, 05/31/2018, instead of Saturday, 05/29/2018. 3. Shoulder tenderness and joint pain Etiology secondary to work related tasks. Patient seen by orthopedics and received a cortisone injection with relief of symptoms. 4. HYPOnatremia and hypocalcemia Sodium 134.  Calcium 8.6. Continue to encourage fluids with electrolytes and calcium rich foods. Continue to monitor. 5.   Fatigue             Etiology likely related to working full time + oral chemotherapy.             B12 336 on 05/07/2018.  B12 goal 400.             Folate 19.9 (normal).             TSH 2.011 (normal). 6.    RTC on 06/21/2018 at 8 AM for labs (CBC with diff, CMP, Mg). 7.   RTC on 06/21/2018 for MD assessment (Doximity), review of labs, and initiation of cycle #6 Xeloda.   I discussed the assessment and treatment plan with the patient.  The patient was provided an opportunity to ask questions and all were answered.  The patient agreed with the plan and demonstrated an understanding of the instructions.  The patient was advised to call back or seek an in person evaluation if the symptoms worsen or if the condition fails to improve as anticipated.  I provided 18 minutes (3:41 PM - 3:59 PM) of face-to-face video visit time during this this encounter and > 50% was spent counseling as documented under my assessment and plan.  I provided these services from the [facility] office.   Nolon Stalls, MD, PhD  05/24/2018, 3:41 PM  I, Molly Dorshimer, am acting as Education administrator for [employer]. Mike Gip, MD, PhD.

## 2018-05-24 ENCOUNTER — Inpatient Hospital Stay: Payer: BC Managed Care – PPO | Attending: Hematology and Oncology

## 2018-05-24 ENCOUNTER — Encounter: Payer: Self-pay | Admitting: Hematology and Oncology

## 2018-05-24 ENCOUNTER — Other Ambulatory Visit: Payer: Self-pay

## 2018-05-24 ENCOUNTER — Ambulatory Visit: Payer: Self-pay | Admitting: Hematology and Oncology

## 2018-05-24 ENCOUNTER — Inpatient Hospital Stay (HOSPITAL_BASED_OUTPATIENT_CLINIC_OR_DEPARTMENT_OTHER): Payer: BC Managed Care – PPO | Admitting: Hematology and Oncology

## 2018-05-24 DIAGNOSIS — R197 Diarrhea, unspecified: Secondary | ICD-10-CM | POA: Insufficient documentation

## 2018-05-24 DIAGNOSIS — E538 Deficiency of other specified B group vitamins: Secondary | ICD-10-CM | POA: Insufficient documentation

## 2018-05-24 DIAGNOSIS — M25512 Pain in left shoulder: Secondary | ICD-10-CM | POA: Insufficient documentation

## 2018-05-24 DIAGNOSIS — E871 Hypo-osmolality and hyponatremia: Secondary | ICD-10-CM

## 2018-05-24 DIAGNOSIS — C187 Malignant neoplasm of sigmoid colon: Secondary | ICD-10-CM | POA: Insufficient documentation

## 2018-05-24 DIAGNOSIS — M25511 Pain in right shoulder: Secondary | ICD-10-CM | POA: Insufficient documentation

## 2018-05-24 DIAGNOSIS — E1122 Type 2 diabetes mellitus with diabetic chronic kidney disease: Secondary | ICD-10-CM | POA: Insufficient documentation

## 2018-05-24 DIAGNOSIS — N182 Chronic kidney disease, stage 2 (mild): Secondary | ICD-10-CM | POA: Diagnosis not present

## 2018-05-24 DIAGNOSIS — R5383 Other fatigue: Secondary | ICD-10-CM | POA: Insufficient documentation

## 2018-05-24 DIAGNOSIS — Z7984 Long term (current) use of oral hypoglycemic drugs: Secondary | ICD-10-CM | POA: Diagnosis not present

## 2018-05-24 DIAGNOSIS — I129 Hypertensive chronic kidney disease with stage 1 through stage 4 chronic kidney disease, or unspecified chronic kidney disease: Secondary | ICD-10-CM | POA: Diagnosis not present

## 2018-05-24 DIAGNOSIS — Z800 Family history of malignant neoplasm of digestive organs: Secondary | ICD-10-CM | POA: Insufficient documentation

## 2018-05-24 DIAGNOSIS — R7989 Other specified abnormal findings of blood chemistry: Secondary | ICD-10-CM

## 2018-05-24 DIAGNOSIS — Z79899 Other long term (current) drug therapy: Secondary | ICD-10-CM | POA: Insufficient documentation

## 2018-05-24 LAB — CBC WITH DIFFERENTIAL/PLATELET
Abs Immature Granulocytes: 0.01 10*3/uL (ref 0.00–0.07)
Basophils Absolute: 0 10*3/uL (ref 0.0–0.1)
Basophils Relative: 1 %
Eosinophils Absolute: 0.2 10*3/uL (ref 0.0–0.5)
Eosinophils Relative: 3 %
HCT: 39.1 % (ref 39.0–52.0)
Hemoglobin: 13.7 g/dL (ref 13.0–17.0)
Immature Granulocytes: 0 %
Lymphocytes Relative: 34 %
Lymphs Abs: 1.8 10*3/uL (ref 0.7–4.0)
MCH: 31.1 pg (ref 26.0–34.0)
MCHC: 35 g/dL (ref 30.0–36.0)
MCV: 88.9 fL (ref 80.0–100.0)
Monocytes Absolute: 0.4 10*3/uL (ref 0.1–1.0)
Monocytes Relative: 7 %
Neutro Abs: 3 10*3/uL (ref 1.7–7.7)
Neutrophils Relative %: 55 %
Platelets: 146 10*3/uL — ABNORMAL LOW (ref 150–400)
RBC: 4.4 MIL/uL (ref 4.22–5.81)
RDW: 16.6 % — ABNORMAL HIGH (ref 11.5–15.5)
WBC: 5.4 10*3/uL (ref 4.0–10.5)
nRBC: 0 % (ref 0.0–0.2)

## 2018-05-24 LAB — COMPREHENSIVE METABOLIC PANEL
ALT: 44 U/L (ref 0–44)
AST: 35 U/L (ref 15–41)
Albumin: 4.1 g/dL (ref 3.5–5.0)
Alkaline Phosphatase: 85 U/L (ref 38–126)
Anion gap: 7 (ref 5–15)
BUN: 12 mg/dL (ref 6–20)
CO2: 25 mmol/L (ref 22–32)
Calcium: 8.8 mg/dL — ABNORMAL LOW (ref 8.9–10.3)
Chloride: 102 mmol/L (ref 98–111)
Creatinine, Ser: 1.21 mg/dL (ref 0.61–1.24)
GFR calc Af Amer: >60 mL/min (ref >60)
GFR calc non Af Amer: >60 mL/min (ref >60)
Glucose, Bld: 211 mg/dL — ABNORMAL HIGH (ref 70–99)
Potassium: 4.4 mmol/L (ref 3.5–5.1)
Sodium: 134 mmol/L — ABNORMAL LOW (ref 135–145)
Total Bilirubin: 0.7 mg/dL (ref 0.3–1.2)
Total Protein: 7.9 g/dL (ref 6.5–8.1)

## 2018-05-24 NOTE — Progress Notes (Signed)
Confirmed name, DOB, and address. Denies any concerns.  

## 2018-05-27 ENCOUNTER — Other Ambulatory Visit: Payer: Self-pay

## 2018-05-27 ENCOUNTER — Ambulatory Visit: Payer: Self-pay | Admitting: Hematology and Oncology

## 2018-06-16 NOTE — Progress Notes (Signed)
[facility]  [address] [place], [place] [address] Phone: [phone]  Fax: [phone]   Clinic Day:  06/21/2018  Referring physician: Sofie Hartigan, MD  Chief Complaint: Adam Farrell is a 48 y.o. male with stage IIA sigmoid colon cancer who is seen for assessment prior to initiation of cycle #6Xeloda.  HPI: The patient was last seen in the medical oncology clinic on 05/24/2018 via telemedicine. At that time, he was doing well.  He was tolerating Xeloda.   He started cycle #5 Xeloda on 05/31/2018.   During the interim, he reports that he is "not bad, I'm learning to live with the side effects."  He reports tenderness in his feet and right fingers following his last dose of Xeloda 5 days ago.  He felt like he "was walking across a bed of coals."  He has fatigue and increased nausea, for which he takes Zofran twice daily. Nausea is improved with eating. He has occasional diarrhea, which he attributes to his diet.   He is down 1lb in the clinic today. His blood sugar is 231 today, and he reports he is not adhering to his Metformin regimen. He continues on B12 supplements.   He continues to have bilateral shoulder pain, worse in his left, where he has previously received injections.   He is planning on having his colostomy reversal with Dr. Tama High in 08/2018 right after his last cycle of Xeloda.   Cycle #6 Xeloda will begin today.    Past Medical History:  Diagnosis Date  . CKD (chronic kidney disease), stage II   . Colon cancer ([place])   . Diabetes mellitus without complication ([place])   . Hypertension     Past Surgical History:  Procedure Laterality Date  . COLON RESECTION SIGMOID N/A 12/31/2017   Procedure: COLON RESECTION SIGMOID;  Surgeon: Vickie Epley, MD;  Location: [facility] ORS;  Service: General;  Laterality: N/A;  . COLOSTOMY N/A 12/31/2017   Procedure: COLOSTOMY;  Surgeon: Vickie Epley, MD;  Location: [facility] ORS;   Service: General;  Laterality: N/A;    Family History  Adopted: Yes  Problem Relation Age of Onset  . Colon cancer Paternal Aunt     Social History:  reports that he has never smoked. He has never used smokeless tobacco. He reports current alcohol use. No history on file for drug. Patient drinks beer, sometimes daily. No tobacco use. Patient denies known exposures to radiation on toxins. He is employed full time as a Copywriter, advertising". He has a 40 year old daughter.  He lives in [place]. He is alone today.   Allergies: No Known Allergies  Current Medications: Current Outpatient Medications  Medication Sig Dispense Refill  . lisinopril (PRINIVIL,ZESTRIL) 20 MG tablet Take 20 mg by mouth daily.    . metFORMIN (GLUCOPHAGE-XR) 500 MG 24 hr tablet Take 500 mg by mouth daily with breakfast.     . ondansetron (ZOFRAN-ODT) 4 MG disintegrating tablet Take 1 tablet (4 mg total) by mouth every 8 (eight) hours as needed for nausea or vomiting. 30 tablet 1  . oxyCODONE-acetaminophen (PERCOCET/ROXICET) 5-325 MG tablet Take 1 tablet by mouth every 4 (four) hours as needed for severe pain. 30 tablet 0  . vitamin B-12 (CYANOCOBALAMIN) 1000 MCG tablet Take 1,000 mcg by mouth daily.    . capecitabine (XELODA) 500 MG tablet TAKE 4 TABLETS BY MOUTH TWICE DAILY FOR 14 DAYS OF A 21 DAY CYCLE. (Patient not taking: Reported on 05/07/2018)  112 tablet 0   No current facility-administered medications for this visit.     Review of Systems  Constitutional: Positive for malaise/fatigue and weight loss (1lb). Negative for chills, diaphoresis and fever.       He is "not bad, I'm learning to live with the side effects."   HENT: Negative.  Negative for congestion, ear pain, nosebleeds, sinus pain and sore throat.        Denies mouth sores.   Eyes: Negative.  Negative for blurred vision, double vision, photophobia and pain.  Respiratory: Negative.  Negative for cough, hemoptysis, sputum production and shortness of breath.    Cardiovascular: Negative.  Negative for chest pain, palpitations, orthopnea, leg swelling and PND.  Gastrointestinal: Positive for diarrhea (occasional) and nausea. Negative for abdominal pain, blood in stool, constipation, melena and vomiting.       Colostomy. Eating well.  Genitourinary: Negative.  Negative for dysuria, frequency, hematuria and urgency.  Musculoskeletal: Positive for joint pain (left shoulder s/p injection). Negative for back pain, falls, myalgias and neck pain.  Skin: Negative.  Negative for itching and rash.       Denies any desquamation of hands or feet.  Neurological: Positive for tingling (feet and right fingers) and sensory change (feet and right fingers). Negative for dizziness, tremors, speech change, focal weakness, weakness and headaches.       Denies numbness in hands or feet.  Endo/Heme/Allergies: Negative.  Does not bruise/bleed easily.  Psychiatric/Behavioral: Negative.  Negative for depression and memory loss. The patient is not nervous/anxious and does not have insomnia.   All other systems reviewed and are negative.  Performance status (ECOG): 1  Blood pressure 130/82, pulse 83, temperature (!) 97 F (36.1 C), resp. rate 18, height '5\' 8"'$  (1.727 m), weight 205 lb 7.5 oz (93.2 kg), SpO2 100 %.   Physical Exam  Constitutional: He is oriented to person, place, and time. He appears well-developed and well-nourished. No distress.  HENT:  Head: Normocephalic and atraumatic.  Mouth/Throat: Oropharynx is clear and moist. No oropharyngeal exudate.  Wearing a camo cap and face mask.  Graying hair.  Eyes: Pupils are equal, round, and reactive to light. Conjunctivae and EOM are normal. No scleral icterus.  Blue eyes.  Neck: Normal range of motion. Neck supple. No JVD present.  Cardiovascular: Normal rate, regular rhythm and normal heart sounds.  No murmur heard. Pulmonary/Chest: Effort normal and breath sounds normal. No respiratory distress. He has no wheezes. He  has no rales.  Abdominal: Soft. Bowel sounds are normal. He exhibits no distension. There is no abdominal tenderness. There is no rebound and no guarding.  Colostomy  Musculoskeletal: Normal range of motion.        General: No edema.  Lymphadenopathy:    He has no cervical adenopathy.    He has no axillary adenopathy.       Right: No supraclavicular adenopathy present.       Left: No supraclavicular adenopathy present.  Neurological: He is alert and oriented to person, place, and time.  Skin: Skin is warm and dry. No rash noted. He is not diaphoretic. No erythema. No pallor.  Psychiatric: He has a normal mood and affect. His behavior is normal. Judgment and thought content normal.  Nursing note and vitals reviewed.   Appointment on 06/21/2018  Component Date Value Ref Range Status  . Magnesium 06/21/2018 1.8  1.7 - 2.4 mg/dL Final   Performed at [facility], [address], [place], [place] [address]  .  Sodium 06/21/2018 131* 135 - 145 mmol/L Final  . Potassium 06/21/2018 4.3  3.5 - 5.1 mmol/L Final  . Chloride 06/21/2018 103  98 - 111 mmol/L Final  . CO2 06/21/2018 22  22 - 32 mmol/L Final  . Glucose, Bld 06/21/2018 231* 70 - 99 mg/dL Final  . BUN 06/21/2018 15  6 - 20 mg/dL Final  . Creatinine, Ser 06/21/2018 1.19  0.61 - 1.24 mg/dL Final  . Calcium 06/21/2018 8.4* 8.9 - 10.3 mg/dL Final  . Total Protein 06/21/2018 7.8  6.5 - 8.1 g/dL Final  . Albumin 06/21/2018 3.8  3.5 - 5.0 g/dL Final  . AST 06/21/2018 31  15 - 41 U/L Final  . ALT 06/21/2018 46* 0 - 44 U/L Final  . Alkaline Phosphatase 06/21/2018 79  38 - 126 U/L Final  . Total Bilirubin 06/21/2018 0.8  0.3 - 1.2 mg/dL Final  . GFR calc non Af Amer 06/21/2018 >60  >60 mL/min Final  . GFR calc Af Amer 06/21/2018 >60  >60 mL/min Final  . Anion gap 06/21/2018 6  5 - 15 Final   Performed at [facility], [address], [place], [place] [address]  . WBC 06/21/2018 5.7  4.0 - 10.5 K/uL Final  . RBC  06/21/2018 4.02* 4.22 - 5.81 MIL/uL Final  . Hemoglobin 06/21/2018 13.5  13.0 - 17.0 g/dL Final  . HCT 06/21/2018 38.3* 39.0 - 52.0 % Final  . MCV 06/21/2018 95.3  80.0 - 100.0 fL Final  . MCH 06/21/2018 33.6  26.0 - 34.0 pg Final  . MCHC 06/21/2018 35.2  30.0 - 36.0 g/dL Final  . RDW 06/21/2018 14.6  11.5 - 15.5 % Final  . Platelets 06/21/2018 135* 150 - 400 K/uL Final   Comment: Immature Platelet Fraction may be clinically indicated, consider ordering this additional test [id number]   . nRBC 06/21/2018 0.0  0.0 - 0.2 % Final  . Neutrophils Relative % 06/21/2018 57  % Final  . Neutro Abs 06/21/2018 3.3  1.7 - 7.7 K/uL Final  . Lymphocytes Relative 06/21/2018 32  % Final  . Lymphs Abs 06/21/2018 1.8  0.7 - 4.0 K/uL Final  . Monocytes Relative 06/21/2018 6  % Final  . Monocytes Absolute 06/21/2018 0.3  0.1 - 1.0 K/uL Final  . Eosinophils Relative 06/21/2018 4  % Final  . Eosinophils Absolute 06/21/2018 0.2  0.0 - 0.5 K/uL Final  . Basophils Relative 06/21/2018 1  % Final  . Basophils Absolute 06/21/2018 0.0  0.0 - 0.1 K/uL Final  . Immature Granulocytes 06/21/2018 0  % Final  . Abs Immature Granulocytes 06/21/2018 0.01  0.00 - 0.07 K/uL Final   Performed at [facility], [address], [place], [place] [address]    Assessment:  Adam Farrell is a 48 y.o. male with stage IIA sigmoid colon cancers/p resection and ostomy on 12/31/2017. Pathologyrevealed a 6.3 cm grade II sigmoid adenocarcinoma. Tumor invaded the muscularis propria into the peri-colorectal tissue. All margins were negative. There was no lymphovascular invasion or perineural invasion. There were no tumor deposits. Zero of 20 lymph nodes were positive. Greater omentum excision revealed diffuse acute peritonitis with edema and hemorrhage. Pathologic stagewas T3N0. MMR/MSIwas negative/intact. He presented with rectal bleeding. CEAwas 2.3 on 01/18/2018.  Oncotype DX testing revealed a recurrence  score of 15 which translated to a 12% (95% CI 9-17%) chance of recurrence within 3 years for a T3 tumor with surgery alone. The risk of recurrence within 5  years of adjuvant 5FU/LV is 7%. The risk of recurrence will likely decrease by 1% with the addition of oxaliplatin (risk 6%).  Colonoscopy on 12/23/2017 revealed fungating and infiltrative partially obstructing large mass 32 cm proximal to the anus. The mass was 3.5 cm in length and circumferential. The mass could not be passed with a pediatric scope.  Chest, abdomen and pelvic CTon 12/28/2017 revealed acute inflammatory changes and free fluid associated with the sigmoid colon in the mesentery, centered at the site of the sigmoid colon mass. The differentialincluded complicating features of colonoscopy/biopsy, infectious/inflammatory colitis, or potentially a perforating colon cancer which pre-existed the recent colonoscopy. There was circumferential thickening of the sigmoid colon, compatible with colon cancer. There were multiple small nodules/lymph nodesof the sigmoid mesentery which extended along the inferior mesenteric neurovascular drainage pathway. These may be pathologic with lymphatic extension versus reactive/inflammatory changes.  CEA has been followed: 2.3 on 01/18/2018,2.0 on 03/05/2018, and 2.5 on 04/16/2018.  He is s/p5 cycles of Xeloda(03/06/2018 - 05/08/2018).   Family history is limited. He has an aunt with colon cancer at age 69. [facility] genetic testingwas negative.  Symptomatically, he is doing well.  Exam is unremarkable.  Plan: 1. Labs today: CBC with diff, CMP, Mg. 2. Stage IIA colon cancer Clinically he continues to do well. He is s/p cycle #5 Xeloda at full dose (2000 mg BID). He has had minimal symptoms. Review labs today.  Begin cycle #6 Xeloda today. 3. Shoulder tenderness and joint pain Etiology felt secondary to work related tasks. He has been seen by orthopedics and is s/p injection. 4.  HYPOnatremia and hypocalcemia Sodium 131. Calcium 8.4 (corrected 8.57). Encourage fluids with electrolytes and calcium rich foods. Discuss calcium supplementation. 5. Fatigue Etiology secondary to work and ongoing chemotherapy. B12 336 on 05/07/2018. B12 goal 400. Discuss oral B12. 6.   RTC in 3 weeks for MD assessment, labs (CBC with diff, CMP, Mg, CEA), and initiation of cycle #7 Xeloda.  I discussed the assessment and treatment plan with the patient.  The patient was provided an opportunity to ask questions and all were answered.  The patient agreed with the plan and demonstrated an understanding of the instructions.  The patient was advised to call back if the symptoms worsen or if the condition fails to improve as anticipated.   Lequita Asal, MD, PhD    06/21/2018, 8:47 AM  I, Cloyde Reams Dorshimer, am acting as Education administrator for [employer]. Mike Gip, MD, PhD.  I, Melissa C. Mike Gip, MD, have reviewed the above documentation for accuracy and completeness, and I agree with the above.

## 2018-06-18 ENCOUNTER — Telehealth: Payer: Self-pay | Admitting: *Deleted

## 2018-06-18 NOTE — Telephone Encounter (Signed)
Patient called and stated that he is ready to get on the schedule for a colostomy reversal.

## 2018-06-21 ENCOUNTER — Encounter: Payer: Self-pay | Admitting: Hematology and Oncology

## 2018-06-21 ENCOUNTER — Inpatient Hospital Stay: Payer: BC Managed Care – PPO | Attending: Hematology and Oncology

## 2018-06-21 ENCOUNTER — Inpatient Hospital Stay: Payer: BC Managed Care – PPO | Admitting: Hematology and Oncology

## 2018-06-21 ENCOUNTER — Other Ambulatory Visit: Payer: Self-pay

## 2018-06-21 VITALS — BP 130/82 | HR 83 | Temp 97.0°F | Resp 18 | Ht 68.0 in | Wt 205.5 lb

## 2018-06-21 DIAGNOSIS — Z800 Family history of malignant neoplasm of digestive organs: Secondary | ICD-10-CM | POA: Insufficient documentation

## 2018-06-21 DIAGNOSIS — R7989 Other specified abnormal findings of blood chemistry: Secondary | ICD-10-CM | POA: Diagnosis not present

## 2018-06-21 DIAGNOSIS — C187 Malignant neoplasm of sigmoid colon: Secondary | ICD-10-CM

## 2018-06-21 DIAGNOSIS — M25511 Pain in right shoulder: Secondary | ICD-10-CM | POA: Insufficient documentation

## 2018-06-21 DIAGNOSIS — E119 Type 2 diabetes mellitus without complications: Secondary | ICD-10-CM | POA: Insufficient documentation

## 2018-06-21 DIAGNOSIS — Z7984 Long term (current) use of oral hypoglycemic drugs: Secondary | ICD-10-CM

## 2018-06-21 DIAGNOSIS — I10 Essential (primary) hypertension: Secondary | ICD-10-CM

## 2018-06-21 DIAGNOSIS — E538 Deficiency of other specified B group vitamins: Secondary | ICD-10-CM

## 2018-06-21 DIAGNOSIS — M25512 Pain in left shoulder: Secondary | ICD-10-CM

## 2018-06-21 DIAGNOSIS — Z933 Colostomy status: Secondary | ICD-10-CM | POA: Insufficient documentation

## 2018-06-21 DIAGNOSIS — Z79899 Other long term (current) drug therapy: Secondary | ICD-10-CM | POA: Insufficient documentation

## 2018-06-21 DIAGNOSIS — E871 Hypo-osmolality and hyponatremia: Secondary | ICD-10-CM | POA: Diagnosis not present

## 2018-06-21 DIAGNOSIS — N182 Chronic kidney disease, stage 2 (mild): Secondary | ICD-10-CM

## 2018-06-21 LAB — COMPREHENSIVE METABOLIC PANEL
ALT: 46 U/L — ABNORMAL HIGH (ref 0–44)
AST: 31 U/L (ref 15–41)
Albumin: 3.8 g/dL (ref 3.5–5.0)
Alkaline Phosphatase: 79 U/L (ref 38–126)
Anion gap: 6 (ref 5–15)
BUN: 15 mg/dL (ref 6–20)
CO2: 22 mmol/L (ref 22–32)
Calcium: 8.4 mg/dL — ABNORMAL LOW (ref 8.9–10.3)
Chloride: 103 mmol/L (ref 98–111)
Creatinine, Ser: 1.19 mg/dL (ref 0.61–1.24)
GFR calc Af Amer: >60 mL/min (ref >60)
GFR calc non Af Amer: >60 mL/min (ref >60)
Glucose, Bld: 231 mg/dL — ABNORMAL HIGH (ref 70–99)
Potassium: 4.3 mmol/L (ref 3.5–5.1)
Sodium: 131 mmol/L — ABNORMAL LOW (ref 135–145)
Total Bilirubin: 0.8 mg/dL (ref 0.3–1.2)
Total Protein: 7.8 g/dL (ref 6.5–8.1)

## 2018-06-21 LAB — CBC WITH DIFFERENTIAL/PLATELET
Abs Immature Granulocytes: 0.01 10*3/uL (ref 0.00–0.07)
Basophils Absolute: 0 10*3/uL (ref 0.0–0.1)
Basophils Relative: 1 %
Eosinophils Absolute: 0.2 10*3/uL (ref 0.0–0.5)
Eosinophils Relative: 4 %
HCT: 38.3 % — ABNORMAL LOW (ref 39.0–52.0)
Hemoglobin: 13.5 g/dL (ref 13.0–17.0)
Immature Granulocytes: 0 %
Lymphocytes Relative: 32 %
Lymphs Abs: 1.8 10*3/uL (ref 0.7–4.0)
MCH: 33.6 pg (ref 26.0–34.0)
MCHC: 35.2 g/dL (ref 30.0–36.0)
MCV: 95.3 fL (ref 80.0–100.0)
Monocytes Absolute: 0.3 10*3/uL (ref 0.1–1.0)
Monocytes Relative: 6 %
Neutro Abs: 3.3 10*3/uL (ref 1.7–7.7)
Neutrophils Relative %: 57 %
Platelets: 135 10*3/uL — ABNORMAL LOW (ref 150–400)
RBC: 4.02 MIL/uL — ABNORMAL LOW (ref 4.22–5.81)
RDW: 14.6 % (ref 11.5–15.5)
WBC: 5.7 10*3/uL (ref 4.0–10.5)
nRBC: 0 % (ref 0.0–0.2)

## 2018-06-21 LAB — VITAMIN B12: Vitamin B-12: 760 pg/mL (ref 180–914)

## 2018-06-21 LAB — MAGNESIUM: Magnesium: 1.8 mg/dL (ref 1.7–2.4)

## 2018-06-21 MED ORDER — CAPECITABINE 500 MG PO TABS: ORAL_TABLET | ORAL | 0 refills | Status: DC

## 2018-06-21 NOTE — Telephone Encounter (Signed)
Message left for patient to call back about scheduling surgery.

## 2018-06-21 NOTE — Progress Notes (Signed)
Patient c/o of increase nausea

## 2018-06-30 ENCOUNTER — Telehealth: Payer: Self-pay | Admitting: *Deleted

## 2018-06-30 NOTE — Telephone Encounter (Signed)
Patient called the office today stating that he was ready to get surgery scheduled. He states he will complete his last chemo pill on 08-15-18.  The patient had a telephone visit with Dr. [name] the beginning of April.   Patient states he was told to call our office when he was ready to arrange surgery and that he would also need a colonoscopy completed before surgery about a week prior. Patient reports he has seen Dr. Gustavo Lah in the past.   Patient states he is off work today, Friday (07-02-18), and Monday (07-05-18) and that we can reach him by phone those days. Otherwise, he won't be able to answer his phone while he is at work.   Will have Dr. [name] complete surgery scheduling form and will follow up with patient accordingly. Patient is wanting to have surgery done the later part of the week of 08-23-18. He is aware he would need to have COVID testing done prior.

## 2018-07-11 NOTE — Progress Notes (Signed)
[facility]  [address] [place], [place] [address] Phone: [phone]  Fax: [phone]   Clinic Day:  07/12/2018  Referring physician: Sofie Hartigan, MD  Chief Complaint: Adam Farrell is a 48 y.o. male with stage IIA sigmoid colon cancer who is seen for assessment prior to initiation of cycle #7Xeloda.  HPI: The patient was last seen in the medical oncology clinic on 06/21/2018. At that time, he was doing well.  Exam was unremarkable. Platelets were 135,000. He began cycle #6 of Xeloda.   During the interim, he reports "I'd like to feel better." He is fatigued and weaker than with previous cycles. His energy has not improved following his Xeloda cycle.  He denies any diarrhea, mouth sores, or tenderness on his hands and feet.  He notes nausea while taking Xeloda which has since improved.  He has increased thirst, despite adequate hydration. He reports watery eyes, which he attributes to seasonal allergies.   He does not wish to push back his next cycle by a week. He reports he is trying to finish treatment and undergo colostomy reversal the week of 08/21/2018. He has been napping and resting more.   He has not continued on a calcium supplement. He restarted Metformin. He denies any known exposure to hepatitis. He drank 4 beers yesterday, and 2 earlier in the week. He denies drinking alcohol while taking Xeloda.    Past Medical History:  Diagnosis Date  . CKD (chronic kidney disease), stage II   . Colon cancer ([place])   . Diabetes mellitus without complication ([place])   . Hypertension     Past Surgical History:  Procedure Laterality Date  . COLON RESECTION SIGMOID N/A 12/31/2017   Procedure: COLON RESECTION SIGMOID;  Surgeon: Vickie Epley, MD;  Location: [facility] ORS;  Service: General;  Laterality: N/A;  . COLOSTOMY N/A 12/31/2017   Procedure: COLOSTOMY;  Surgeon: Vickie Epley, MD;  Location: [facility] ORS;  Service: General;   Laterality: N/A;    Family History  Adopted: Yes  Problem Relation Age of Onset  . Colon cancer Paternal Aunt     Social History:  reports that he has never smoked. He has never used smokeless tobacco. He reports current alcohol use. No history on file for drug.Patient drinks beer, sometimes daily. No tobacco use. Patient denies known exposures to radiation on toxins. He is employed full time as a Copywriter, advertising". He has a 53 year old daughter.He lives in [place]. He is alone today.   Allergies: No Known Allergies  Current Medications: Current Outpatient Medications  Medication Sig Dispense Refill  . capecitabine (XELODA) 500 MG tablet TAKE 4 TABLETS BY MOUTH TWICE DAILY FOR 14 DAYS OF A 21 DAY CYCLE. 112 tablet 0  . lisinopril (PRINIVIL,ZESTRIL) 20 MG tablet Take 20 mg by mouth daily.    . metFORMIN (GLUCOPHAGE-XR) 500 MG 24 hr tablet Take 500 mg by mouth daily with breakfast.     . oxyCODONE-acetaminophen (PERCOCET/ROXICET) 5-325 MG tablet Take 1 tablet by mouth every 4 (four) hours as needed for severe pain. 30 tablet 0  . vitamin B-12 (CYANOCOBALAMIN) 1000 MCG tablet Take 1,000 mcg by mouth daily.    . ondansetron (ZOFRAN-ODT) 4 MG disintegrating tablet Take 1 tablet (4 mg total) by mouth every 8 (eight) hours as needed for nausea or vomiting. (Patient not taking: Reported on 07/12/2018) 30 tablet 1   No current facility-administered medications for this visit.     Review of Systems  Constitutional: Positive for malaise/fatigue (increasing) and weight loss (2lbs). Negative for chills, diaphoresis and fever.       "I could be doing better."  HENT: Negative.  Negative for congestion, ear pain, hearing loss, nosebleeds, sinus pain and sore throat.        Denies mouth sores.   Eyes: Negative.  Negative for blurred vision, double vision, photophobia and pain.  Respiratory: Negative.  Negative for cough, hemoptysis, sputum production and shortness of breath.   Cardiovascular: Negative.   Negative for chest pain, palpitations, orthopnea, leg swelling and PND.  Gastrointestinal: Positive for nausea (with tx, resolved). Negative for abdominal pain, blood in stool, constipation, diarrhea, melena and vomiting.       Colostomy. Eating well. Increased thirst.  Genitourinary: Negative.  Negative for dysuria, frequency, hematuria and urgency.  Musculoskeletal: Positive for joint pain (left shoulder s/p injection). Negative for back pain, falls, myalgias and neck pain.  Skin: Negative.  Negative for itching and rash.       Denies any desquamation of hands or feet.  Neurological: Negative for dizziness, tingling (feet and right fingers, resolved), tremors, sensory change (feet and right fingers, resolved), speech change, focal weakness, weakness and headaches.       Denies numbness in hands or feet.  Endo/Heme/Allergies: Positive for environmental allergies. Does not bruise/bleed easily.  Psychiatric/Behavioral: Negative.  Negative for depression and memory loss. The patient is not nervous/anxious and does not have insomnia.   All other systems reviewed and are negative.  Performance status (ECOG): 1  Blood pressure 128/88, pulse 91, temperature (!) 97.5 F (36.4 C), temperature source Tympanic, resp. rate 18, height '5\' 8"'$  (1.727 m), weight 203 lb 9.5 oz (92.4 kg), SpO2 100 %.  Physical Exam  Constitutional: He is oriented to person, place, and time. He appears well-developed and well-nourished. No distress.  HENT:  Head: Normocephalic and atraumatic.  Mouth/Throat: Oropharynx is clear and moist. No oropharyngeal exudate.  Wearing a camo cap and face mask.  Graying hair.  Eyes: Pupils are equal, round, and reactive to light. Conjunctivae and EOM are normal. No scleral icterus.  Blue eyes.  Neck: Normal range of motion. Neck supple. No JVD present.  Cardiovascular: Normal rate, regular rhythm and normal heart sounds.  No murmur heard. Pulmonary/Chest: Effort normal and breath sounds  normal. No respiratory distress. He has no wheezes. He has no rales.  Abdominal: Soft. Bowel sounds are normal. He exhibits no distension and no mass. There is no hepatosplenomegaly. There is no abdominal tenderness. There is no rebound and no guarding.  Colostomy  Musculoskeletal: Normal range of motion.        General: No edema.  Lymphadenopathy:    He has no cervical adenopathy.    He has no axillary adenopathy.       Right: No supraclavicular adenopathy present.       Left: No supraclavicular adenopathy present.  Neurological: He is alert and oriented to person, place, and time.  Skin: Skin is warm and dry. No rash noted. He is not diaphoretic. No erythema. No pallor.  Very tan/red skin.  Psychiatric: He has a normal mood and affect. His behavior is normal. Judgment and thought content normal.  Nursing note and vitals reviewed.   Appointment on 07/12/2018  Component Date Value Ref Range Status  . WBC 07/12/2018 5.4  4.0 - 10.5 K/uL Final  . RBC 07/12/2018 4.10* 4.22 - 5.81 MIL/uL Final  . Hemoglobin 07/12/2018 14.0  13.0 - 17.0 g/dL Final  .  HCT 07/12/2018 39.1  39.0 - 52.0 % Final  . MCV 07/12/2018 95.4  80.0 - 100.0 fL Final  . MCH 07/12/2018 34.1* 26.0 - 34.0 pg Final  . MCHC 07/12/2018 35.8  30.0 - 36.0 g/dL Final  . RDW 07/12/2018 14.4  11.5 - 15.5 % Final  . Platelets 07/12/2018 125* 150 - 400 K/uL Final  . nRBC 07/12/2018 0.0  0.0 - 0.2 % Final  . Neutrophils Relative % 07/12/2018 57  % Final  . Neutro Abs 07/12/2018 3.1  1.7 - 7.7 K/uL Final  . Lymphocytes Relative 07/12/2018 32  % Final  . Lymphs Abs 07/12/2018 1.7  0.7 - 4.0 K/uL Final  . Monocytes Relative 07/12/2018 7  % Final  . Monocytes Absolute 07/12/2018 0.4  0.1 - 1.0 K/uL Final  . Eosinophils Relative 07/12/2018 3  % Final  . Eosinophils Absolute 07/12/2018 0.1  0.0 - 0.5 K/uL Final  . Basophils Relative 07/12/2018 1  % Final  . Basophils Absolute 07/12/2018 0.0  0.0 - 0.1 K/uL Final  . Immature  Granulocytes 07/12/2018 0  % Final  . Abs Immature Granulocytes 07/12/2018 0.01  0.00 - 0.07 K/uL Final   Performed at [facility], [address] [place], [place] [address]    Assessment:  Adam Farrell is a 48 y.o. male with stage IIA sigmoid colon cancers/p resection and ostomy on 12/31/2017. Pathologyrevealed a 6.3 cm grade II sigmoid adenocarcinoma. Tumor invaded the muscularis propria into the peri-colorectal tissue. All margins were negative. There was no lymphovascular invasion or perineural invasion. There were no tumor deposits. Zero of 20 lymph nodes were positive. Greater omentum excision revealed diffuse acute peritonitis with edema and hemorrhage. Pathologic stagewas T3N0. MMR/MSIwas negative/intact. He presented with rectal bleeding. CEAwas 2.3 on 01/18/2018.  Oncotype DX testing revealed a recurrence score of 15 which translated to a 12% (95% CI 9-17%) chance of recurrence within 3 years for a T3 tumor with surgery alone. The risk of recurrence within 5 years of adjuvant 5FU/LV is 7%. The risk of recurrence will likely decrease by 1% with the addition of oxaliplatin (risk 6%).  Colonoscopy on 12/23/2017 revealed fungating and infiltrative partially obstructing large mass 32 cm proximal to the anus. The mass was 3.5 cm in length and circumferential. The mass could not be passed with a pediatric scope.  Chest, abdomen and pelvic CTon 12/28/2017 revealed acute inflammatory changes and free fluid associated with the sigmoid colon in the mesentery, centered at the site of the sigmoid colon mass. The differentialincluded complicating features of colonoscopy/biopsy, infectious/inflammatory colitis, or potentially a perforating colon cancer which pre-existed the recent colonoscopy. There was circumferential thickening of the sigmoid colon, compatible with colon cancer. There were multiple small nodules/lymph nodesof the sigmoid mesentery which extended  along the inferior mesenteric neurovascular drainage pathway. These may be pathologic with lymphatic extension versus reactive/inflammatory changes.  CEA has been followed: 2.3 on 01/18/2018,2.0 on 03/05/2018, and 2.5 on 04/16/2018.  He is s/p6cycles of Xeloda(03/06/2018 - 06/21/2018).   Family history is limited. He has an aunt with colon cancer at age 83. [facility] genetic testingwas negative.  Symptomatically, he is doing well.  He continues to have some fatigue.  Nausea has improved.  He denies any mouth sores or diarrhea.  Exam is unremarkable.  Plan: 1.   Labs today:CBC with diff, CMP, Mg, CEA. 2.   Stage IIA colon cancer Clinically he is doing well. He is s/p 6 cycles of Xeloda. Review plan for 8  cycles of Xeloda (6 months of adjuvant therapy). He is tolerating treatment well. Review labs today.  He has some mild increased LFTs of unclear etiology.  Discuss plan for rechecking LFTs prior to next cycle 3.   Increased liver function tests  AST 45.  ALT 58.  Etiology unclear.  Patient denies recent alcohol intake.  Recheck LFTs later this week.  If LFTs do not improve   Check hepatitis serologies and consider imaging. 4.   HYPOnatremia and hypocalcemia Sodium 134 (improved). Calcium 8.7. Continue to encourage calcium rich foods and supplementation.  5. Fatigue Etiology secondary to work and ongoing chemotherapy. B12 was 336on 05/07/2018. B12 goal 400. 6.   RTC on 07/15/2018 for labs (CBC, LFTs). 7.   RN to call patient with lab results 8.   RTC in 24 days for MD assessment, labs (CBC with diff, CMP, Mg, CEA), and initiation of cycle #8 Xeloda.  I discussed the assessment and treatment plan with the patient.  The patient was provided an opportunity to ask questions and all were answered.  The patient agreed with the plan and demonstrated an understanding of the instructions.  The patient was advised to call back if the symptoms worsen or  if the condition fails to improve as anticipated.    Lequita Asal, MD, PhD    07/12/2018, 9:07 AM  I, Molly Dorshimer, am acting as Education administrator for [employer]. Mike Gip, MD, PhD.  I, Melissa C. Mike Gip, MD, have reviewed the above documentation for accuracy and completeness, and I agree with the above.

## 2018-07-12 ENCOUNTER — Other Ambulatory Visit: Payer: Self-pay

## 2018-07-12 ENCOUNTER — Encounter: Payer: Self-pay | Admitting: Hematology and Oncology

## 2018-07-12 ENCOUNTER — Inpatient Hospital Stay: Payer: BC Managed Care – PPO | Admitting: Hematology and Oncology

## 2018-07-12 ENCOUNTER — Inpatient Hospital Stay: Payer: BC Managed Care – PPO

## 2018-07-12 VITALS — BP 128/88 | HR 91 | Temp 97.5°F | Resp 18 | Ht 68.0 in | Wt 203.6 lb

## 2018-07-12 DIAGNOSIS — E871 Hypo-osmolality and hyponatremia: Secondary | ICD-10-CM

## 2018-07-12 DIAGNOSIS — R5383 Other fatigue: Secondary | ICD-10-CM

## 2018-07-12 DIAGNOSIS — C187 Malignant neoplasm of sigmoid colon: Secondary | ICD-10-CM

## 2018-07-12 DIAGNOSIS — R945 Abnormal results of liver function studies: Secondary | ICD-10-CM

## 2018-07-12 DIAGNOSIS — R7989 Other specified abnormal findings of blood chemistry: Secondary | ICD-10-CM

## 2018-07-12 DIAGNOSIS — Z7189 Other specified counseling: Secondary | ICD-10-CM

## 2018-07-12 LAB — CBC WITH DIFFERENTIAL/PLATELET
Abs Immature Granulocytes: 0.01 10*3/uL (ref 0.00–0.07)
Basophils Absolute: 0 10*3/uL (ref 0.0–0.1)
Basophils Relative: 1 %
Eosinophils Absolute: 0.1 10*3/uL (ref 0.0–0.5)
Eosinophils Relative: 3 %
HCT: 39.1 % (ref 39.0–52.0)
Hemoglobin: 14 g/dL (ref 13.0–17.0)
Immature Granulocytes: 0 %
Lymphocytes Relative: 32 %
Lymphs Abs: 1.7 10*3/uL (ref 0.7–4.0)
MCH: 34.1 pg — ABNORMAL HIGH (ref 26.0–34.0)
MCHC: 35.8 g/dL (ref 30.0–36.0)
MCV: 95.4 fL (ref 80.0–100.0)
Monocytes Absolute: 0.4 10*3/uL (ref 0.1–1.0)
Monocytes Relative: 7 %
Neutro Abs: 3.1 10*3/uL (ref 1.7–7.7)
Neutrophils Relative %: 57 %
Platelets: 125 10*3/uL — ABNORMAL LOW (ref 150–400)
RBC: 4.1 MIL/uL — ABNORMAL LOW (ref 4.22–5.81)
RDW: 14.4 % (ref 11.5–15.5)
WBC: 5.4 10*3/uL (ref 4.0–10.5)
nRBC: 0 % (ref 0.0–0.2)

## 2018-07-12 LAB — COMPREHENSIVE METABOLIC PANEL
ALT: 58 U/L — ABNORMAL HIGH (ref 0–44)
AST: 45 U/L — ABNORMAL HIGH (ref 15–41)
Albumin: 4 g/dL (ref 3.5–5.0)
Alkaline Phosphatase: 78 U/L (ref 38–126)
Anion gap: 8 (ref 5–15)
BUN: 14 mg/dL (ref 6–20)
CO2: 21 mmol/L — ABNORMAL LOW (ref 22–32)
Calcium: 8.7 mg/dL — ABNORMAL LOW (ref 8.9–10.3)
Chloride: 105 mmol/L (ref 98–111)
Creatinine, Ser: 1.24 mg/dL (ref 0.61–1.24)
GFR calc Af Amer: >60 mL/min (ref >60)
GFR calc non Af Amer: >60 mL/min (ref >60)
Glucose, Bld: 194 mg/dL — ABNORMAL HIGH (ref 70–99)
Potassium: 4.2 mmol/L (ref 3.5–5.1)
Sodium: 134 mmol/L — ABNORMAL LOW (ref 135–145)
Total Bilirubin: 0.9 mg/dL (ref 0.3–1.2)
Total Protein: 8 g/dL (ref 6.5–8.1)

## 2018-07-12 LAB — MAGNESIUM: Magnesium: 1.9 mg/dL (ref 1.7–2.4)

## 2018-07-12 NOTE — Progress Notes (Signed)
Patient c/o just feeling a little weaker than normal

## 2018-07-13 LAB — HEPATITIS PANEL, ACUTE
HCV Ab: 0.1 s/co ratio (ref 0.0–0.9)
Hep A IgM: NEGATIVE
Hep B C IgM: NEGATIVE
Hepatitis B Surface Ag: NEGATIVE

## 2018-07-13 LAB — HEPATITIS B CORE ANTIBODY, TOTAL: Hep B Core Total Ab: NEGATIVE

## 2018-07-13 LAB — CEA: CEA: 3 ng/mL (ref 0.0–4.7)

## 2018-07-15 ENCOUNTER — Other Ambulatory Visit: Payer: Self-pay

## 2018-07-15 ENCOUNTER — Inpatient Hospital Stay: Payer: BC Managed Care – PPO | Attending: Hematology and Oncology

## 2018-07-15 ENCOUNTER — Other Ambulatory Visit: Payer: Self-pay | Admitting: Hematology and Oncology

## 2018-07-15 DIAGNOSIS — R7989 Other specified abnormal findings of blood chemistry: Secondary | ICD-10-CM

## 2018-07-15 DIAGNOSIS — E871 Hypo-osmolality and hyponatremia: Secondary | ICD-10-CM | POA: Diagnosis not present

## 2018-07-15 DIAGNOSIS — R945 Abnormal results of liver function studies: Secondary | ICD-10-CM | POA: Insufficient documentation

## 2018-07-15 DIAGNOSIS — C187 Malignant neoplasm of sigmoid colon: Secondary | ICD-10-CM | POA: Insufficient documentation

## 2018-07-15 DIAGNOSIS — R5383 Other fatigue: Secondary | ICD-10-CM | POA: Diagnosis not present

## 2018-07-15 LAB — CBC
HCT: 38.9 % — ABNORMAL LOW (ref 39.0–52.0)
Hemoglobin: 14 g/dL (ref 13.0–17.0)
MCH: 34.1 pg — ABNORMAL HIGH (ref 26.0–34.0)
MCHC: 36 g/dL (ref 30.0–36.0)
MCV: 94.6 fL (ref 80.0–100.0)
Platelets: 133 10*3/uL — ABNORMAL LOW (ref 150–400)
RBC: 4.11 MIL/uL — ABNORMAL LOW (ref 4.22–5.81)
RDW: 13.7 % (ref 11.5–15.5)
WBC: 5.5 10*3/uL (ref 4.0–10.5)
nRBC: 0 % (ref 0.0–0.2)

## 2018-07-15 LAB — HEPATIC FUNCTION PANEL
ALT: 53 U/L — ABNORMAL HIGH (ref 0–44)
AST: 40 U/L (ref 15–41)
Albumin: 4 g/dL (ref 3.5–5.0)
Alkaline Phosphatase: 85 U/L (ref 38–126)
Bilirubin, Direct: 0.1 mg/dL (ref 0.0–0.2)
Indirect Bilirubin: 0.8 mg/dL (ref 0.3–0.9)
Total Bilirubin: 0.9 mg/dL (ref 0.3–1.2)
Total Protein: 7.9 g/dL (ref 6.5–8.1)

## 2018-07-27 ENCOUNTER — Ambulatory Visit: Payer: BLUE CROSS/BLUE SHIELD | Admitting: Surgery

## 2018-08-04 ENCOUNTER — Telehealth: Payer: Self-pay

## 2018-08-04 NOTE — Telephone Encounter (Signed)
Spoke to wife regarding patient not receiving results. Wife states patient was upset that he did not get a call about his results from 07/15/18. Apologized on behalf of the team to the patient's wife and requested that patient call back to reschedule appt. Wife verbalizes understanding and denies any further questions.

## 2018-08-05 ENCOUNTER — Ambulatory Visit: Payer: BC Managed Care – PPO | Admitting: Hematology and Oncology

## 2018-08-05 ENCOUNTER — Other Ambulatory Visit: Payer: BC Managed Care – PPO

## 2018-08-12 ENCOUNTER — Ambulatory Visit: Payer: BC Managed Care – PPO | Admitting: General Surgery

## 2019-01-07 IMAGING — DX DG CHEST 1V
1 series · 1 of 1 positions shown · non-contrast
Comparison: None.

CLINICAL DATA: Physical exam.

EXAM:
CHEST  1 VIEW

[chest pa]
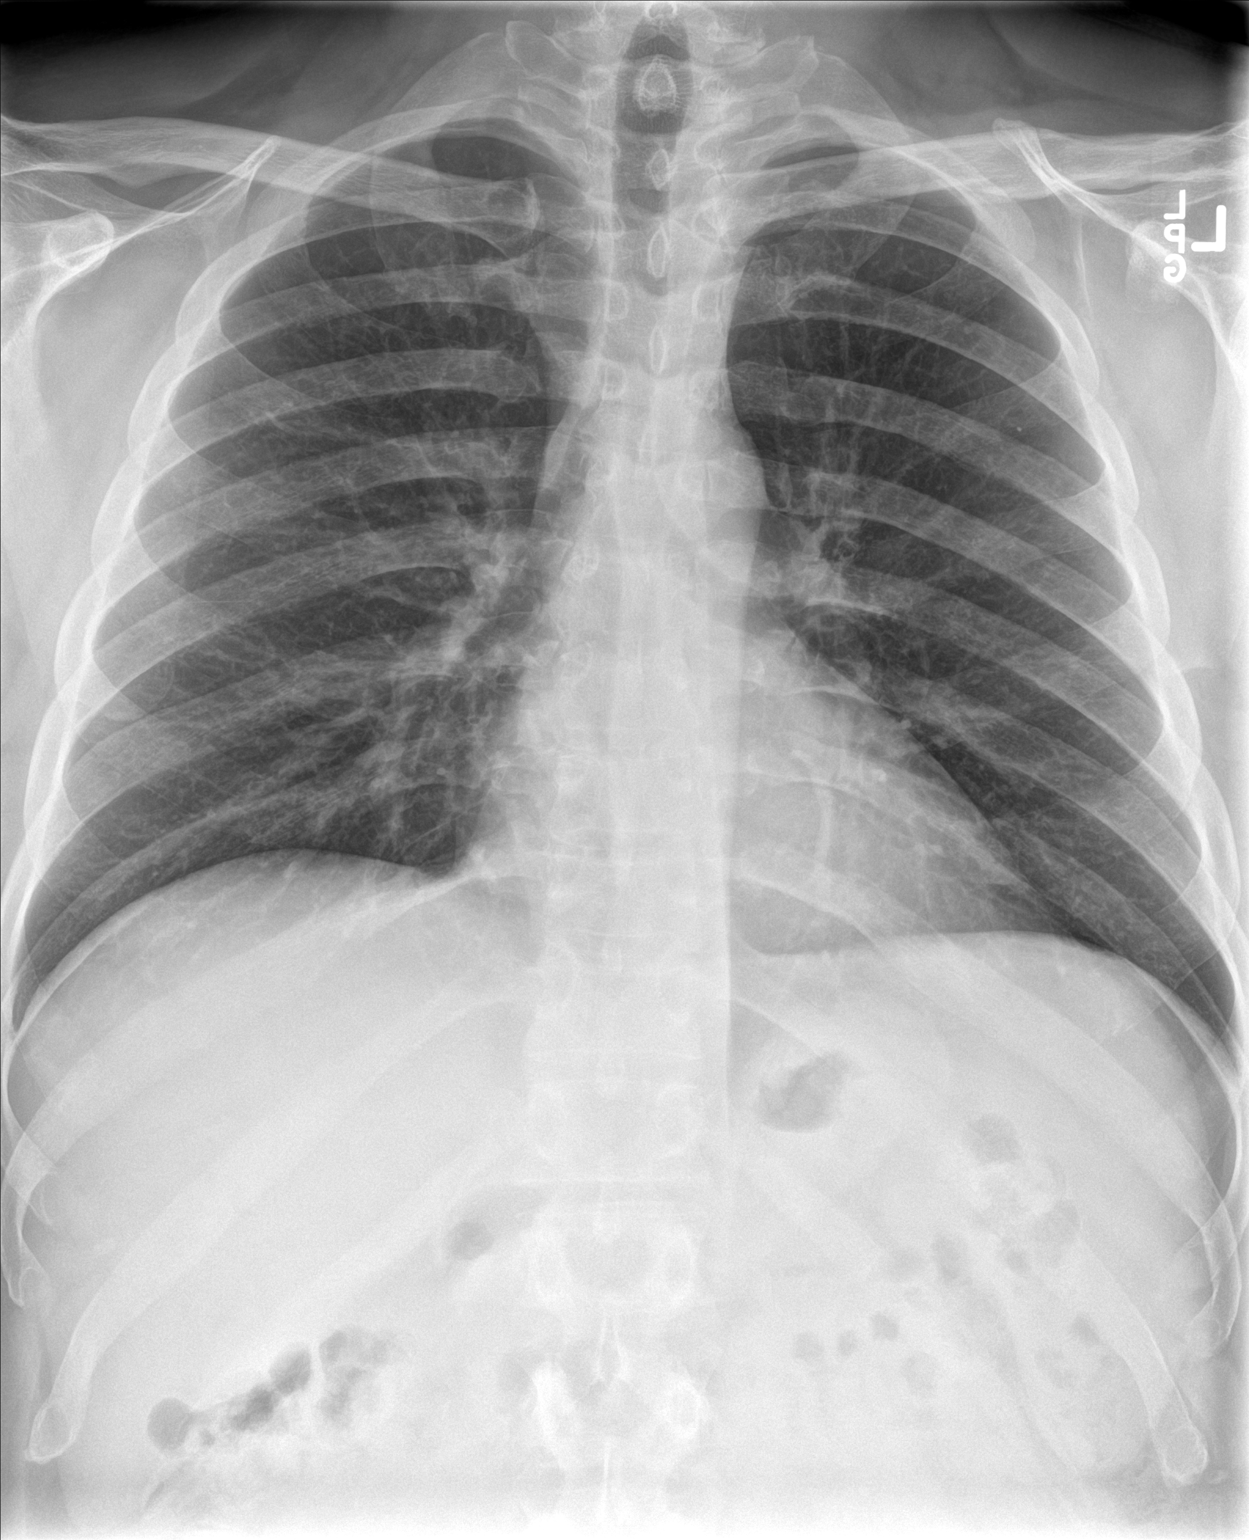

[1 of 1 positions shown; findings below may reference images not displayed]

FINDINGS: The heart size and mediastinal contours are within normal limits.
Both lungs are clear. The visualized skeletal structures are
unremarkable.
IMPRESSION: No active disease.

## 2019-08-11 IMAGING — CT CT CHEST W/ CM
2 of 5 series · 13 of 46 positions shown, 15 images · IV contrast (iopamidol)
Comparison: CT 08/19/2009

CLINICAL DATA: 47-year-old male with abdominal pain. Recent
colonoscopy with diagnosis of colon cancer/mass

EXAM:
CT CHEST, ABDOMEN, AND PELVIS WITH CONTRAST
TECHNIQUE: Multidetector CT imaging of the chest, abdomen and pelvis was
performed following the standard protocol during bolus
administration of intravenous contrast.
CONTRAST:  100mL [id number] IOPAMIDOL ([id number]) INJECTION 61%

[Series 3: cap with · axial · 0.78mm/px · z∈[+572,+1132]mm · 10 of 134 slices shown, 12 images]
[im 11/134  soft-tissue]
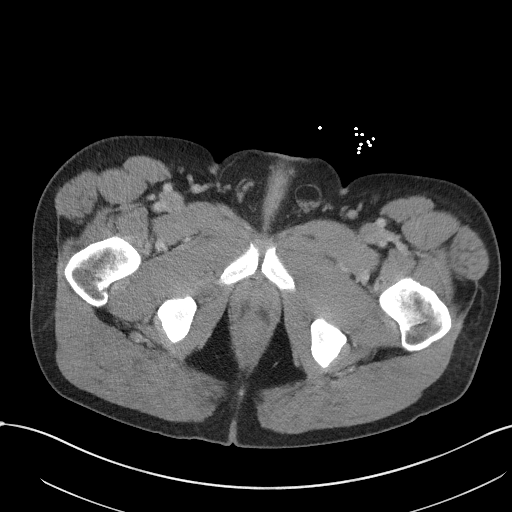
[im 11/134  bone]
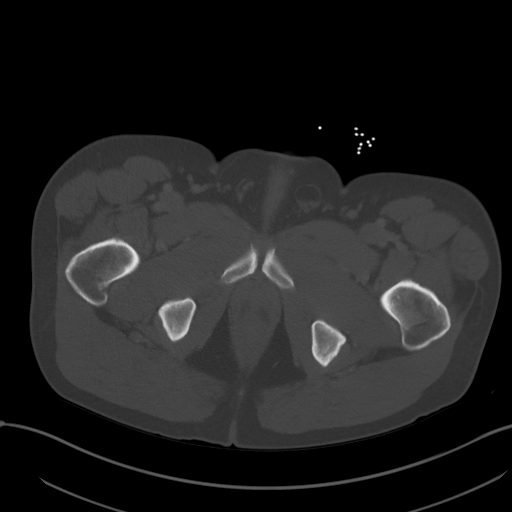
[im 21/134  soft-tissue]
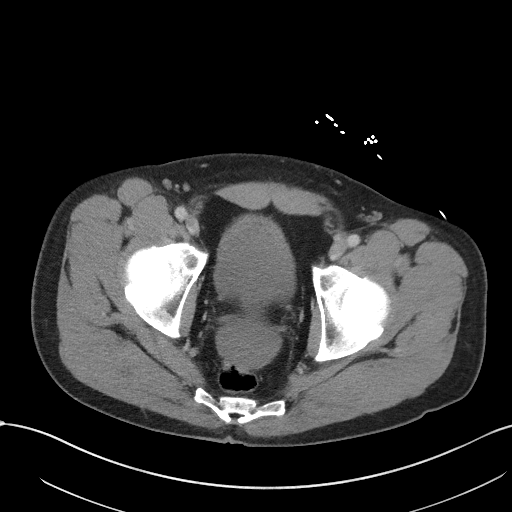
[im 41/134  soft-tissue]
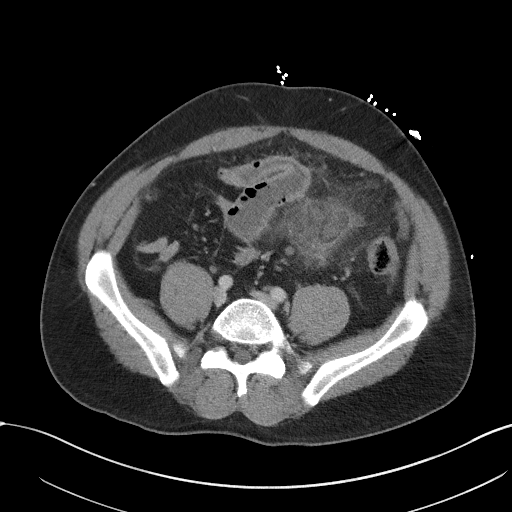
[im 52/134  soft-tissue]
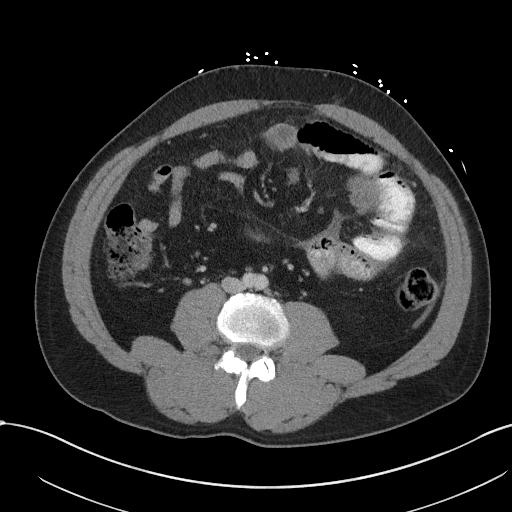
[im 62/134  soft-tissue]
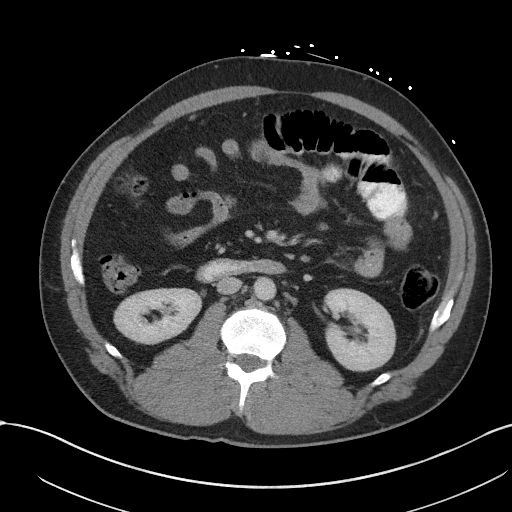
[im 72/134  soft-tissue]
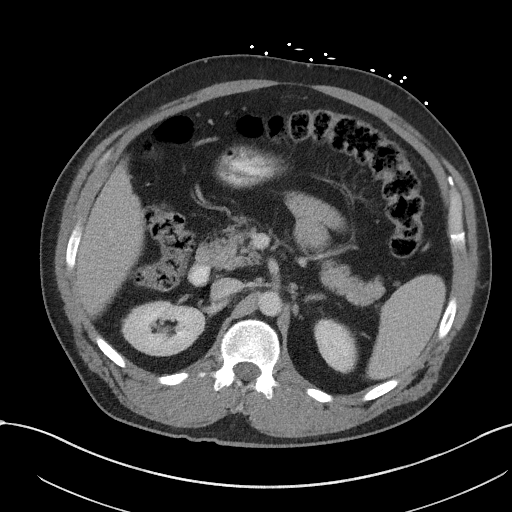
[im 82/134  soft-tissue]
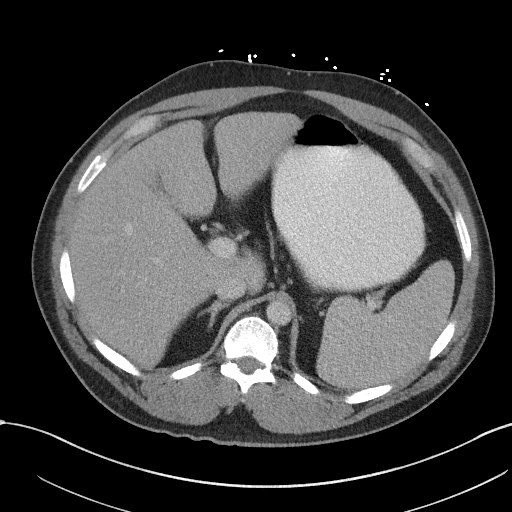
[im 103/134  soft-tissue]
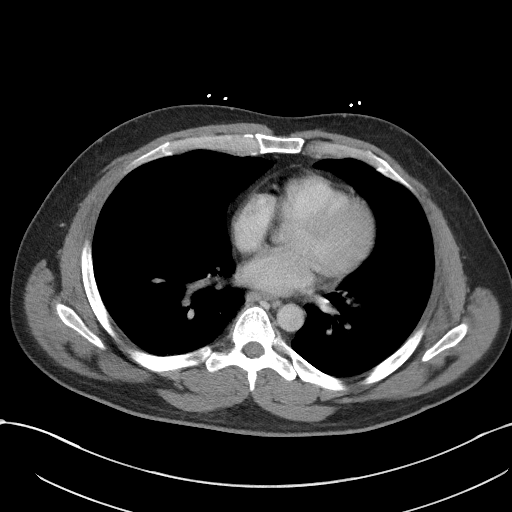
[im 113/134  soft-tissue]
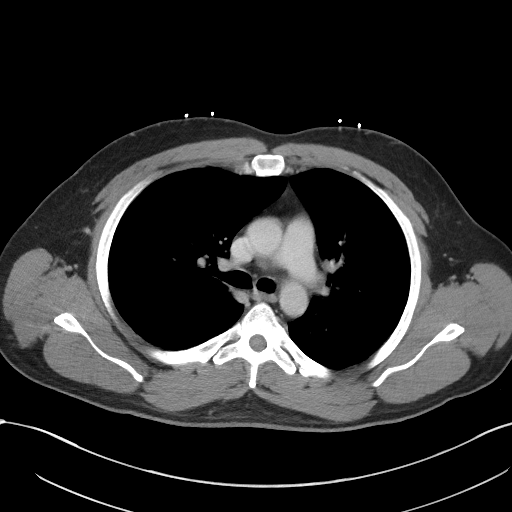
[im 113/134  bone]
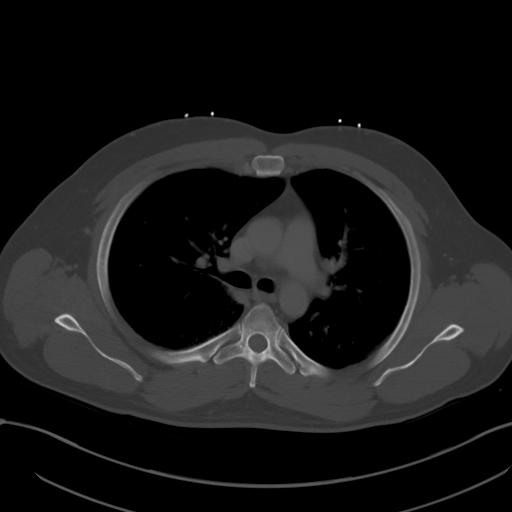
[im 123/134  soft-tissue]
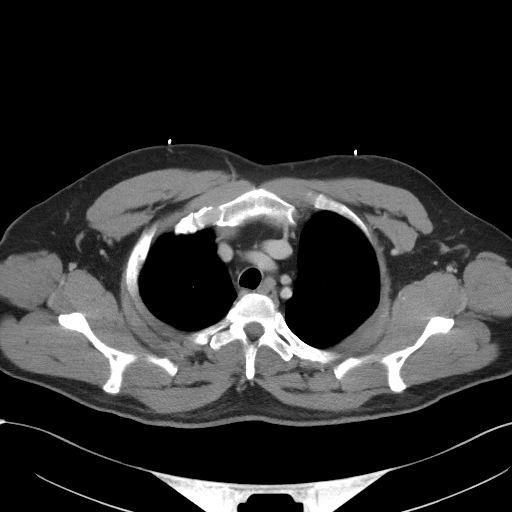

[Series 6: coronals · coronal · 0.77mm/px · 3 of 135 slices shown]
[im 45/135  soft-tissue]
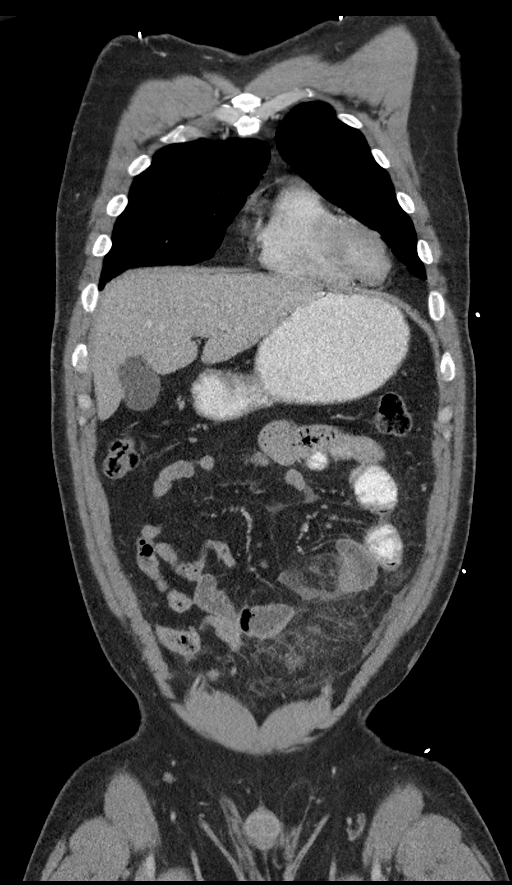
[im 60/135  soft-tissue]
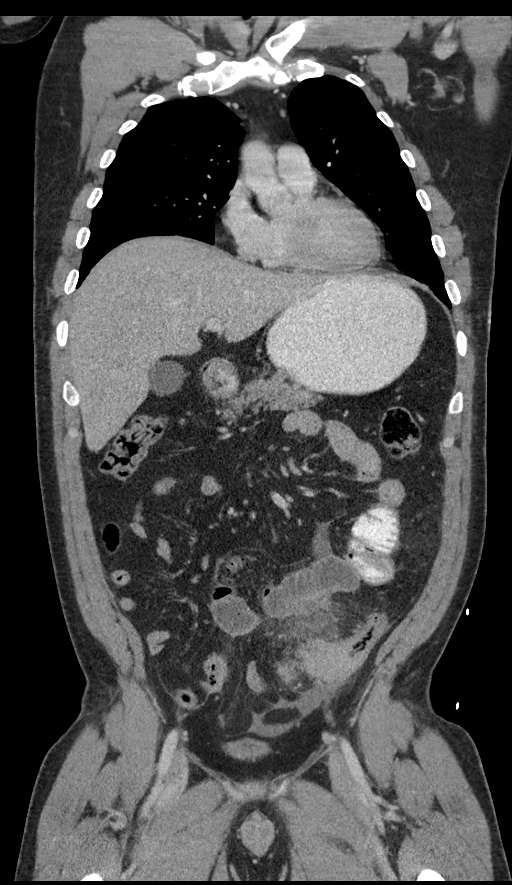
[im 75/135  soft-tissue]
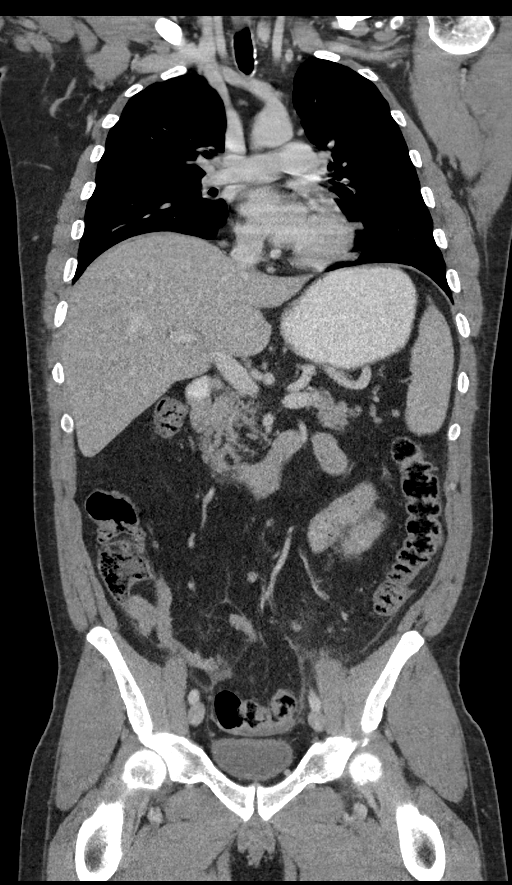

[13 of 46 positions shown; findings below may reference images not displayed]

FINDINGS: CT CHEST FINDINGS

Cardiovascular: Heart size within normal limits. No pericardial
fluid/thickening. Unremarkable course caliber and contour of the
thoracic aorta. No significant atherosclerotic changes.

Mediastinum/Nodes: Small lymph nodes of the mediastinum, none of
which are enlarged. No available for comparison. Unremarkable
appearance of the thoracic esophagus. Central airways are patent
with no debris. Unremarkable appearance of the thoracic inlet. No
supraclavicular or axillary adenopathy.

Lungs/Pleura: No pneumothorax or pleural effusion. No confluent
airspace disease.

Musculoskeletal: No acute displaced fracture. No aggressive
sclerotic or lytic lesions. No bony canal narrowing.

CT ABDOMEN PELVIS FINDINGS

Hepatobiliary: Coarse calcification of the right liver, which is
otherwise unremarkable. Unremarkable appearance of the gallbladder.

Pancreas: Unremarkable pancreas

Spleen: Unremarkable spleen

Adrenals/Urinary Tract: Unremarkable appearance of the adrenal
glands. No evidence of hydronephrosis of the right or left kidney.
No nephrolithiasis. Unremarkable course of the bilateral ureters.
Unremarkable appearance of the urinary bladder.

Stomach/Bowel: Unremarkable stomach.

Proximal small bowel unremarkable. There is dilated distal jejunum
and ileum with air-fluid levels. Enteric contrast does not reach the
distal ileum. The dilated small bowel is in the region of the left
abdomen with inflammatory changes of the fat and interloop fluid.
Mild focal wall thickening in a short segment of small bowel in the
mid abdomen (image 94 series 3). Small bowel wall appears to enhance
normally without focal thickening.

Circumferential sigmoid colonic wall thickening on image 101 of
series 3. Small volume of low-density free fluid adjacent to the
sigmoid colon at this site, within the left pericolic gutter, and
extending into the dependent pelvis in the rectovesical space. No
free air or rim enhancing focal fluid.

There are multiple nodules/lymph nodes within the mesenteric fat of
the sigmoid colon, and extending in the drainage pathway of the
inferior mesenteric neurovascular bundle.

Multiple small nodules/lymph nodes of small bowel mesentery, many of
which were present on the comparison CT of 2255.

Normal appendix.

Vascular/Lymphatic: No significant atherosclerotic calcifications.
Proximal femoral arteries are patent.

Reproductive: Calcifications of the prostate. Transverse diameter
prostate measures 3.6 cm.

Other: Fat containing left inguinal hernia

Musculoskeletal: No acute displaced fracture. No aggressive bony or
lytic lesions.
IMPRESSION: Acute inflammatory changes and free fluid associated with the
sigmoid colon in the mesentery, centered at the site of the sigmoid
colon mass, which is reported to have been recently biopsied during
colonoscopy. The differential includes complicating features of
colonoscopy/biopsy, infectious/inflammatory colitis, or potentially
a perforating colon cancer which pre-existed the recent colonoscopy.

CT demonstrates circumferential thickening of the sigmoid colon,
compatible with colon cancer as is the given history. There are
multiple small nodules/lymph nodes of the sigmoid mesentery which
extend along the inferior mesenteric neurovascular drainage pathway.
These may be pathologic with lymphatic extension versus
reactive/inflammatory changes. Correlation with staging PET-CT may
be useful.

Borderline dilated small bowel with air-fluid levels, favored to
represent reactive ileus given the degree of inflammation of the
mesentery, and the absence of a discrete transition point..

These results were called by telephone at the time of interpretation
on 12/28/2017 at [DATE] to Dr. MI JA RANUCCI.

## 2021-12-18 ENCOUNTER — Ambulatory Visit
Admission: EM | Admit: 2021-12-18 | Discharge: 2021-12-18 | Disposition: A | Discharge status: Home / Self Care | Payer: BC Managed Care – PPO | Attending: Emergency Medicine | Admitting: Emergency Medicine

## 2021-12-18 DIAGNOSIS — J019 Acute sinusitis, unspecified: Secondary | ICD-10-CM | POA: Insufficient documentation

## 2021-12-18 DIAGNOSIS — Z20822 Contact with and (suspected) exposure to covid-19: Secondary | ICD-10-CM | POA: Insufficient documentation

## 2021-12-18 LAB — RESP PANEL BY RT-PCR (FLU A&B, COVID) ARPGX2
Influenza A by PCR: NEGATIVE
Influenza B by PCR: NEGATIVE
SARS Coronavirus 2 by RT PCR: NEGATIVE

## 2021-12-18 MED ORDER — FLUTICASONE PROPIONATE 50 MCG/ACT NA SUSP: 2.0000 | Freq: Every day | NASAL | 0 refills | Status: AC

## 2021-12-18 NOTE — ED Provider Notes (Signed)
HPI  SUBJECTIVE:  Adam Farrell is a 51 y.o. male who presents with 3 days of sinus pain and pressure, nasal congestion, rhinorrhea, postnasal drip.  He reports a sore throat yesterday secondary to postnasal drip, this has resolved.  No fevers, body aches, headaches, loss of sense of smell or taste, cough, wheeze, shortness of breath, nausea, vomiting, diarrhea, abdominal pain, facial swelling, upper dental pain.  No known COVID, flu, RSV exposure.  He did not get the COVID or flu vaccines.  No antibiotics in the past month. No antipyretic in the past 6 hours.  He tried hot shower and Mucinex with improvement in his symptoms.  No aggravating factors.  He has a past medical history of colon cancer, currently not on oral chemotherapy, diabetes, hypertension, chronic kidney disease stage II, sinusitis twice a year.  PCP: [facility].  He is requesting a work note.    Past Medical History:  Diagnosis Date   CKD (chronic kidney disease), stage II    Colon cancer ([place])    Diabetes mellitus without complication ([place])    Hypertension     Past Surgical History:  Procedure Laterality Date   COLON RESECTION SIGMOID N/A 12/31/2017   Procedure: COLON RESECTION SIGMOID;  Surgeon: Vickie Epley, MD;  Location: [facility] ORS;  Service: General;  Laterality: N/A;   COLOSTOMY N/A 12/31/2017   Procedure: COLOSTOMY;  Surgeon: Vickie Epley, MD;  Location: [facility] ORS;  Service: General;  Laterality: N/A;    Family History  Adopted: Yes  Problem Relation Age of Onset   Colon cancer Paternal Aunt     Social History   Tobacco Use   Smoking status: Never   Smokeless tobacco: Never  Vaping Use   Vaping Use: Never used  Substance Use Topics   Alcohol use: Yes   Drug use: Never    No current facility-administered medications for this encounter.  Current Outpatient Medications:    fluticasone (FLONASE) 50 MCG/ACT nasal spray, Place 2 sprays into both nostrils daily., Disp: 16 g, Rfl: 0    capecitabine (XELODA) 500 MG tablet, TAKE 4 TABLETS BY MOUTH TWICE DAILY FOR 14 DAYS OF A 21 DAY CYCLE., Disp: 112 tablet, Rfl: 0   lisinopril (PRINIVIL,ZESTRIL) 20 MG tablet, Take 20 mg by mouth daily., Disp: , Rfl:    metFORMIN (GLUCOPHAGE-XR) 500 MG 24 hr tablet, Take 500 mg by mouth daily with breakfast. , Disp: , Rfl:    vitamin B-12 (CYANOCOBALAMIN) 1000 MCG tablet, Take 1,000 mcg by mouth daily., Disp: , Rfl:   No Known Allergies   ROS  As noted in HPI.   Physical Exam  BP (!) 165/94 (BP Location: Left Arm)   Pulse (!) 112   Temp 98.4 F (36.9 C) (Oral)   Resp 18   Ht '5\' 7"'$  (1.702 m)   Wt 91.9 kg   SpO2 95%   BMI 31.72 kg/m   Constitutional: Well developed, well nourished, no acute distress Eyes:  EOMI, conjunctiva normal bilaterally HENT: Normocephalic, atraumatic,mucus membranes moist.  Mucoid nasal congestion.  Normal turbinates.  No maxillary, frontal sinus tenderness.  Positive postnasal drip. Respiratory: Normal inspiratory effort lungs clear bilaterally.  Good air movement. Cardiovascular:Regular tachycardia, no murmurs rubs or gallops GI: nondistended skin: No rash, skin intact Musculoskeletal: no deformities Neurologic: Alert & oriented x 3, no focal neuro deficits Psychiatric: Speech and behavior appropriate   ED Course   Medications - No data to display  Orders Placed This Encounter  Procedures  Resp Panel by RT-PCR (Flu A&B, Covid) Anterior Nasal Swab    Standing Status:   Standing    Number of Occurrences:   1    Results for orders placed or performed during the hospital encounter of 12/18/21 (from the past 24 hour(s))  Resp Panel by RT-PCR (Flu A&B, Covid) Anterior Nasal Swab     Status: None   Collection Time: 12/18/21  8:42 AM   Specimen: Anterior Nasal Swab  Result Value Ref Range   SARS Coronavirus 2 by RT PCR NEGATIVE NEGATIVE   Influenza A by PCR NEGATIVE NEGATIVE   Influenza B by PCR NEGATIVE NEGATIVE   No results found.  ED  Clinical Impression  1. Acute non-recurrent sinusitis, unspecified location   2. Encounter for laboratory testing for COVID-19 virus      ED Assessment/Plan      Checking COVID, flu because of comorbidities.  Patient declined antivirals even though I discussed with him that he has comorbidities that could increase his risk for serious illness.  However, it does affect how long I write him out of work.  Presentation consistent with a viral sinus infection.  Continue Mucinex, start Flonase, advised saline nasal irrigation.  Patient declined a wait-and-see prescription of Augmentin.  Follow-up with PCP as needed.  COVID, influenza negative.  Plan as above  Discussed labs, MDM, treatment plan, and plan for follow-up with patient. . patient agrees with plan.   Meds ordered this encounter  Medications   fluticasone (FLONASE) 50 MCG/ACT nasal spray    Sig: Place 2 sprays into both nostrils daily.    Dispense:  16 g    Refill:  0      *This clinic note was created using [employer]. Therefore, there may be occasional mistakes despite careful proofreading.  ?    Melynda Ripple, MD 12/19/21 1009

## 2021-12-18 NOTE — Discharge Instructions (Signed)
Continue Mucinex to keep the mucous thin and to decongest you.   You may take 600 mg of motrin with 1000 mg of tylenol up to 3-4 times a day as needed for pain. This is an effective combination for pain.  Most sinus infections are viral and do not need antibiotics unless you have a high fever, have had this for 10 days, or you get better and then get sick again. Use a NeilMed sinus rinse with distilled water as often as you want to to reduce nasal congestion. Follow the directions on the box.  Try some Flonase as well.  I will contact you if and only if your COVID and flu, are positive.  Go to www.goodrx.com to look up your medications. This will give you a list of where you can find your prescriptions at the most affordable prices. Or you can ask the pharmacist what the cash price is. This is frequently cheaper than going through insurance.

## 2021-12-18 NOTE — ED Triage Notes (Signed)
Pt c/o nasal drainage x3days  Pt has taken OTC mucinex DM and it helped a little.  Pt asks for a work note.  Pt is not worried about covid or flu.

## 2022-04-24 ENCOUNTER — Ambulatory Visit
Admission: EM | Admit: 2022-04-24 | Discharge: 2022-04-24 | Disposition: A | Discharge status: Home / Self Care | Payer: BC Managed Care – PPO | Attending: Family Medicine | Admitting: Family Medicine

## 2022-04-24 ENCOUNTER — Encounter: Payer: Self-pay | Admitting: Emergency Medicine

## 2022-04-24 DIAGNOSIS — H66002 Acute suppurative otitis media without spontaneous rupture of ear drum, left ear: Secondary | ICD-10-CM | POA: Diagnosis not present

## 2022-04-24 MED ORDER — CEFDINIR 300 MG PO CAPS: 300.0000 mg | ORAL_CAPSULE | Freq: Two times a day (BID) | ORAL | 0 refills | Status: AC

## 2022-04-24 NOTE — ED Provider Notes (Signed)
[facility]    CSN: [id number] Arrival date & time: 04/24/22  0810      History   Chief Complaint Chief Complaint  Patient presents with   Otalgia    HPI Adam Farrell is a 52 y.o. male.   HPI   Adam Farrell presents for left ear pain with associated discharge, hearing loss, and pressure.  He follows with Dr Genevive Bi, ENT and needed to have a tube put in his ear. The tube has felll out about 5 years ago. Denies fever or other symptoms.  Symptoms started on Sunday night. Tried some over the counter stuff.    Past Medical History:  Diagnosis Date   CKD (chronic kidney disease), stage II    Colon cancer    Diabetes mellitus without complication    Hypertension     Patient Active Problem List   Diagnosis Date Noted   Hypocalcemia 06/19/2018   Low vitamin B12 level 05/24/2018   Hyponatremia 05/23/2018   Other fatigue 05/23/2018   Normocytic anemia 01/25/2018   Goals of care, counseling/discussion 01/23/2018   Other partial intestinal obstruction    Colon cancer 01/13/2017   Mass of left hand 08/01/2016   Hypercholesterolemia 07/03/2016   Uncontrolled type 2 diabetes mellitus without complication, without long-term current use of insulin 01/02/2016   Essential hypertension 01/02/2016    Past Surgical History:  Procedure Laterality Date   COLON RESECTION SIGMOID N/A 12/31/2017   Procedure: COLON RESECTION SIGMOID;  Surgeon: Ancil Linsey, MD;  Location: [facility];  Service: General;  Laterality: N/A;   COLOSTOMY N/A 12/31/2017   Procedure: COLOSTOMY;  Surgeon: Ancil Linsey, MD;  Location: [facility];  Service: General;  Laterality: N/A;       Home Medications    Prior to Admission medications   Medication Sig Start Date End Date Taking? Authorizing Provider  cefdinir (OMNICEF) 300 MG capsule Take 1 capsule (300 mg total) by mouth 2 (two) times daily for 10 days. 04/24/22 05/04/22 Yes Jaeson Molstad, DO  capecitabine (XELODA) 500 MG tablet TAKE 4  TABLETS BY MOUTH TWICE DAILY FOR 14 DAYS OF A 21 DAY CYCLE. 07/16/18   Corcoran, Ferdie Ping, MD  fluticasone (FLONASE) 50 MCG/ACT nasal spray Place 2 sprays into both nostrils daily. 12/18/21   Domenick Gong, MD  lisinopril (PRINIVIL,ZESTRIL) 20 MG tablet Take 20 mg by mouth daily.    [provider]  metFORMIN (GLUCOPHAGE-XR) 500 MG 24 hr tablet Take 500 mg by mouth daily with breakfast.  06/10/17 07/12/18  [provider]  vitamin B-12 (CYANOCOBALAMIN) 1000 MCG tablet Take 1,000 mcg by mouth daily.    [provider]    Family History Family History  Adopted: Yes  Problem Relation Age of Onset   Colon cancer Paternal Aunt     Social History Social History   Tobacco Use   Smoking status: Never   Smokeless tobacco: Never  Vaping Use   Vaping Use: Never used  Substance Use Topics   Alcohol use: Yes   Drug use: Never     Allergies   Chocolate flavor, Atorvastatin, and Rosuvastatin   Review of Systems Review of Systems: :negative unless otherwise stated in HPI.      Physical Exam Triage Vital Signs ED Triage Vitals  Enc Vitals Group     BP 04/24/22 0825 (!) 153/97     Pulse Rate 04/24/22 0825 86     Resp 04/24/22 0825 16     Temp 04/24/22 0825 98.1 F (  36.7 C)     Temp Source 04/24/22 0825 Oral     SpO2 04/24/22 0825 95 %     Weight --      Height --      Head Circumference --      Peak Flow --      Pain Score 04/24/22 0824 4     Pain Loc --      Pain Edu? --      Excl. in GC? --    No data found.  Updated Vital Signs BP (!) 153/97 (BP Location: Left Arm)   Pulse 86   Temp 98.1 F (36.7 C) (Oral)   Resp 16   SpO2 95%   Visual Acuity Right Eye Distance:   Left Eye Distance:   Bilateral Distance:    Right Eye Near:   Left Eye Near:    Bilateral Near:     Physical Exam GEN:     alert, well appearing male in no distress    HENT:  mucus membranes moist,  no nasal discharge, right TM normal, left TM purulent discharge in  canal, normal external auditory canals bilaterally, nontender tragus ENT Physical Exam   EYES:   no scleral injection without drainage NECK:  normal ROM RESP:  no increased work of breathing CVS:   regular rate  Skin:   warm and dry   UC Treatments / Results  Labs (all labs ordered are listed, but only abnormal results are displayed) Labs Reviewed - No data to display  EKG   Radiology No results found.  Procedures Procedures (including critical care time)  Medications Ordered in UC Medications - No data to display  Initial Impression / Assessment and Plan / UC Course  I have reviewed the triage vital signs and the nursing notes.  Pertinent labs & imaging results that were available during my care of the patient were reviewed by me and considered in my medical decision making (see chart for details).        Acute Otitis media Pt 52 yo male with his of recurrent ear infections who presents for left ear pain.  Overall, patient is well-appearing, well-hydrated and without respiratory distress. Auden is afebrile. Treat with amoxicillin for 10 days.  Tylenol/Motrin's as needed for fever or discomfort.  Stressed importance of hydration.  Work note provided, per request. He is to follow up with ENT.   Discussed MDM, treatment plan and plan for follow-up with patient who agrees with plan.   Final Clinical Impressions(s) / UC Diagnoses   Final diagnoses:  Non-recurrent acute suppurative otitis media of left ear without spontaneous rupture of tympanic membrane     Discharge Instructions      Stop by the pharmacy to pick up your prescriptions.  Follow up with your primary care provider as needed.      ED Prescriptions     Medication Sig Dispense Auth. Provider   cefdinir (OMNICEF) 300 MG capsule Take 1 capsule (300 mg total) by mouth 2 (two) times daily for 10 days. 20 capsule Katha Cabal, DO      PDMP not reviewed this encounter.   Katha Cabal,  DO 04/24/22 0840

## 2022-04-24 NOTE — ED Triage Notes (Signed)
Pt presents with left ear pain and drainage x 4 days. Pt has tried to treat with OTC ear drops.

## 2022-04-24 NOTE — Discharge Instructions (Signed)
Stop by the pharmacy to pick up your prescriptions.  Follow up with your primary care provider as needed.  

## 2023-10-13 ENCOUNTER — Encounter: Payer: Self-pay | Admitting: Emergency Medicine

## 2023-10-13 ENCOUNTER — Ambulatory Visit
Admission: EM | Admit: 2023-10-13 | Discharge: 2023-10-13 | Discharge status: Against Medical Advice | Attending: Emergency Medicine | Admitting: Emergency Medicine

## 2023-10-13 DIAGNOSIS — R739 Hyperglycemia, unspecified: Secondary | ICD-10-CM

## 2023-10-13 DIAGNOSIS — E1165 Type 2 diabetes mellitus with hyperglycemia: Secondary | ICD-10-CM | POA: Diagnosis not present

## 2023-10-13 DIAGNOSIS — E1122 Type 2 diabetes mellitus with diabetic chronic kidney disease: Secondary | ICD-10-CM | POA: Insufficient documentation

## 2023-10-13 DIAGNOSIS — Z91120 Patient's intentional underdosing of medication regimen due to financial hardship: Secondary | ICD-10-CM | POA: Diagnosis not present

## 2023-10-13 DIAGNOSIS — R350 Frequency of micturition: Secondary | ICD-10-CM | POA: Diagnosis not present

## 2023-10-13 DIAGNOSIS — Z85038 Personal history of other malignant neoplasm of large intestine: Secondary | ICD-10-CM | POA: Insufficient documentation

## 2023-10-13 DIAGNOSIS — E119 Type 2 diabetes mellitus without complications: Secondary | ICD-10-CM

## 2023-10-13 DIAGNOSIS — R3915 Urgency of urination: Secondary | ICD-10-CM | POA: Diagnosis not present

## 2023-10-13 DIAGNOSIS — I129 Hypertensive chronic kidney disease with stage 1 through stage 4 chronic kidney disease, or unspecified chronic kidney disease: Secondary | ICD-10-CM | POA: Diagnosis not present

## 2023-10-13 DIAGNOSIS — Z87442 Personal history of urinary calculi: Secondary | ICD-10-CM | POA: Insufficient documentation

## 2023-10-13 DIAGNOSIS — T383X6A Underdosing of insulin and oral hypoglycemic [antidiabetic] drugs, initial encounter: Secondary | ICD-10-CM | POA: Diagnosis not present

## 2023-10-13 DIAGNOSIS — M545 Low back pain, unspecified: Secondary | ICD-10-CM | POA: Diagnosis present

## 2023-10-13 DIAGNOSIS — N182 Chronic kidney disease, stage 2 (mild): Secondary | ICD-10-CM | POA: Insufficient documentation

## 2023-10-13 LAB — URINALYSIS, W/ REFLEX TO CULTURE (INFECTION SUSPECTED)
Bacteria, UA: NONE SEEN
Bilirubin Urine: NEGATIVE
Glucose, UA: >=500 mg/dL — AB
Hgb urine dipstick: NEGATIVE
Ketones, ur: NEGATIVE mg/dL
Leukocytes,Ua: NEGATIVE
Nitrite: NEGATIVE
Protein, ur: NEGATIVE mg/dL
Specific Gravity, Urine: <1.005 — ABNORMAL LOW (ref 1.005–1.030)
pH: 5 (ref 5.0–8.0)

## 2023-10-13 LAB — GLUCOSE, CAPILLARY: Glucose-Capillary: 396 mg/dL — ABNORMAL HIGH (ref 70–99)

## 2023-10-13 MED ORDER — ONDANSETRON 4 MG PO TBDP
4.0000 mg | ORAL_TABLET | Freq: Once | ORAL | Status: AC
  Administered 2023-10-13: 4 mg via ORAL

## 2023-10-13 MED ORDER — IBUPROFEN 800 MG PO TABS
800.0000 mg | ORAL_TABLET | Freq: Once | ORAL | Status: AC
  Administered 2023-10-13: 800 mg via ORAL

## 2023-10-13 NOTE — ED Triage Notes (Signed)
 Pt presents with left side back pain that started today. Pt believes he has a kidney stone. Pt has not taken anything for the pain.

## 2023-10-13 NOTE — ED Provider Notes (Addendum)
 [facility]    CSN: [id number] Arrival date & time: 10/13/23  1225      History   Chief Complaint Chief Complaint  Patient presents with   Back Pain    HPI Adam Farrell is a 53 y.o. male.   HPI  53 year old male with past medical history significant for hypertension, diabetes, colon cancer, chronic kidney disease stage II, and kidney stones presents for evaluation of acute onset left flank pain that started around 930 this morning.  He is rating it currently as a 7/10.  He has had some nausea and some urinary urgency and frequency but he denies fever, pain with urination, or blood in his urine.  He has also not experienced any vomiting.  He feels as though it is a kidney stone.  Past Medical History:  Diagnosis Date   CKD (chronic kidney disease), stage II    Colon cancer (HCC)    Diabetes mellitus without complication (HCC)    Hypertension     Patient Active Problem List   Diagnosis Date Noted   Hypocalcemia 06/19/2018   Low vitamin B12 level 05/24/2018   Hyponatremia 05/23/2018   Other fatigue 05/23/2018   Normocytic anemia 01/25/2018   Goals of care, counseling/discussion 01/23/2018   Other partial intestinal obstruction (HCC)    Colon cancer (HCC) 01/13/2017   Mass of left hand 08/01/2016   Hypercholesterolemia 07/03/2016   Uncontrolled type 2 diabetes mellitus without complication, without long-term current use of insulin  01/02/2016   Essential hypertension 01/02/2016    Past Surgical History:  Procedure Laterality Date   COLON RESECTION SIGMOID N/A 12/31/2017   Procedure: COLON RESECTION SIGMOID;  Surgeon: Nicholaus Selinda Birmingham, MD;  Location: [facility];  Service: General;  Laterality: N/A;   COLOSTOMY N/A 12/31/2017   Procedure: COLOSTOMY;  Surgeon: Nicholaus Selinda Birmingham, MD;  Location: [facility];  Service: General;  Laterality: N/A;       Home Medications    Prior to Admission medications   Medication Sig Start Date End Date Taking? Authorizing  Provider  meloxicam  (MOBIC ) 15 MG tablet TAKE 1 TABLET BY MOUTH EVERY DAY FOR 30 DAYS WITH MEALS 07/22/21  Yes [provider]  capecitabine  (XELODA ) 500 MG tablet TAKE 4 TABLETS BY MOUTH TWICE DAILY FOR 14 DAYS OF A 21 DAY CYCLE. 07/16/18   Corcoran, Melissa C, MD  fluticasone  (FLONASE ) 50 MCG/ACT nasal spray Place 2 sprays into both nostrils daily. 12/18/21   Van Knee, MD  lisinopril  (PRINIVIL ,ZESTRIL ) 20 MG tablet Take 20 mg by mouth daily.    [provider]  metFORMIN  (GLUCOPHAGE -XR) 500 MG 24 hr tablet Take 500 mg by mouth daily with breakfast.  06/10/17 07/12/18  [provider]  vitamin B-12 (CYANOCOBALAMIN) 1000 MCG tablet Take 1,000 mcg by mouth daily.    [provider]    Family History Family History  Adopted: Yes  Problem Relation Age of Onset   Colon cancer Paternal Aunt     Social History Social History   Tobacco Use   Smoking status: Never   Smokeless tobacco: Never  Vaping Use   Vaping status: Never Used  Substance Use Topics   Alcohol use: Yes   Drug use: Never     Allergies   Chocolate flavoring agent (non-screening), Atorvastatin, and Rosuvastatin    Review of Systems Review of Systems  Constitutional:  Negative for fever.  Gastrointestinal:  Positive for nausea. Negative for vomiting.  Genitourinary:  Positive for flank pain, frequency and urgency. Negative for  dysuria and hematuria.     Physical Exam Triage Vital Signs ED Triage Vitals  Encounter Vitals Group     BP      Girls Systolic BP Percentile      Girls Diastolic BP Percentile      Boys Systolic BP Percentile      Boys Diastolic BP Percentile      Pulse      Resp      Temp      Temp src      SpO2      Weight      Height      Head Circumference      Peak Flow      Pain Score      Pain Loc      Pain Education      Exclude from Growth Chart    No data found.  Updated Vital Signs BP (!) 147/99 (BP Location: Right Arm)   Pulse 89    Temp 98.6 F (37 C) (Oral)   Resp 18   SpO2 98%   Visual Acuity Right Eye Distance:   Left Eye Distance:   Bilateral Distance:    Right Eye Near:   Left Eye Near:    Bilateral Near:     Physical Exam Vitals and nursing note reviewed.  Constitutional:      Appearance: Normal appearance. He is not ill-appearing.  HENT:     Head: Normocephalic and atraumatic.  Cardiovascular:     Rate and Rhythm: Normal rate and regular rhythm.     Pulses: Normal pulses.     Heart sounds: Normal heart sounds. No murmur heard.    No friction rub. No gallop.  Pulmonary:     Effort: Pulmonary effort is normal.     Breath sounds: Normal breath sounds. No wheezing, rhonchi or rales.  Abdominal:     Tenderness: There is left CVA tenderness. There is no right CVA tenderness.     Comments: Mild left-sided CVA tenderness.  Right side is benign.  Musculoskeletal:        General: Tenderness present.  Skin:    General: Skin is warm and dry.     Capillary Refill: Capillary refill takes less than 2 seconds.  Neurological:     General: No focal deficit present.     Mental Status: He is alert and oriented to person, place, and time.      UC Treatments / Results  Labs (all labs ordered are listed, but only abnormal results are displayed) Labs Reviewed  URINALYSIS, W/ REFLEX TO CULTURE (INFECTION SUSPECTED) - Abnormal; Notable for the following components:      Result Value   Specific Gravity, Urine <1.005 (*)    Glucose, UA >=500 (*)    All other components within normal limits  GLUCOSE, CAPILLARY - Abnormal; Notable for the following components:   Glucose-Capillary 396 (*)    All other components within normal limits  CBG MONITORING, ED    EKG   Radiology No results found.  Procedures Procedures (including critical care time)  Medications Ordered in UC Medications  ibuprofen (ADVIL) tablet 800 mg (800 mg Oral Given 10/13/23 1314)  ondansetron  (ZOFRAN -ODT) disintegrating tablet 4 mg  (4 mg Oral Given 10/13/23 1315)    Initial Impression / Assessment and Plan / UC Course  I have reviewed the triage vital signs and the nursing notes.  Pertinent labs & imaging results that were available during my care of the patient were reviewed  by me and considered in my medical decision making (see chart for details).   Patient is a pleasant 53 year old male presenting for evaluation of acute onset left flank pain.  In the exam room he does appear to be in a mild degree of discomfort and he does have mild CVA tenderness on the left to percussion.  His right side is benign.  He also has some tenderness to palpation of the right lateral lower thoracic paraspinous muscle group.  There is muscle tension present but I do not appreciate any overt spasm.  Given that patient has had a kidney stone in the past, and this feels similar, I will order urinalysis to assess for the presence of blood or any evidence of infection given that he is also had urinary urgency and frequency.  If any blood is present in the urine I will order imaging to assess for the presence of possible renal stone.  If there is no blood present in the urine then I will treat the patient for musculoskeletal back pain.  Most recent blood work available in epic is from [facility] dated 06/20/2022 which shows normal renal function with a BUN of 16 and a creatinine of 1.0.  Patient has normal renal function going back to 2022.  I offered the patient medication for pain and nausea and he agreed.  He reports he would prefer to take a pill over an injection.  I will order 100 mg of ibuprofen along with 4 mg of Zofran  ODT.  Urinalysis shows greater than 500 glucose but is negative for hemoglobin, bilirubin, ketones, protein, leukocyte esterase, or nitrites.  Reflex microscopy is unremarkable.  I will order a fingerstick glucose as the patient reports he has not eaten lunch today.  He is not on any medication that should cause  glucosuria.  I discussed the findings of the urine with the patient and he reports that he is not taking any medication currently including his metformin .  He is also not currently seeing any medical providers to include his primary care because he has not been able to afford his medication.  He reports that he last took his metformin  approximately 18 months ago and maybe even longer ago than that.  Fingerstick glucose is 396.  I discussed the findings of the fingerstick glucose with the patient and advised him that hyperglycemia can in fact cause muscle pain and irritation which may be the cause of his back pain.  I have encouraged the patient to go to the emergency department where he can receive IV fluids, insulin  to bring his sugar down, and then get restarted on medication to control his diabetes.  He reports he feels better now than he ever did on metformin .  I advised the patient that there are better medications than metformin  that he could be started on for control of his blood sugar.  He is unsure if he wants to go to the emergency department or not.  I will have him sign out AMA for hyperglycemia and back pain.  I advised him that he is at increased risk for heart attack, stroke, neuropathy, vision loss, and death by having his blood sugar remains so high and his diabetes untreated.   Final Clinical Impressions(s) / UC Diagnoses   Final diagnoses:  Type 2 diabetes mellitus without complication, without long-term current use of insulin  (HCC)  Hyperglycemia  Acute left-sided low back pain without sciatica     Discharge Instructions  As we discussed, I would highly recommend that she go to the emergency department to be evaluated for your elevated blood sugar as I believe your elevated blood sugar is what is causing your muscle pain.  You need IV fluids and insulin  to bring your blood sugar down which we are unable to provide here in urgent care.  You have stated that she wanted to  think about it and you were not sure of what you want to do.  I would again encourage you to rethink and go to the ER.     ED Prescriptions   None    PDMP not reviewed this encounter.   Bernardino Ditch, NP 10/13/23 1353    Bernardino Ditch, NP 10/13/23 1357    Bernardino Ditch, NP 10/13/23 [phone]

## 2023-10-13 NOTE — Discharge Instructions (Addendum)
 As we discussed, I would highly recommend that she go to the emergency department to be evaluated for your elevated blood sugar as I believe your elevated blood sugar is what is causing your muscle pain.  You need IV fluids and insulin  to bring your blood sugar down which we are unable to provide here in urgent care.  You have stated that she wanted to think about it and you were not sure of what you want to do.  I would again encourage you to rethink and go to the ER.

## 2024-01-02 NOTE — ED Provider Notes (Signed)
 [facility] Emergency Department Provider Note    ED Clinical Impression    Final diagnoses:  Bilateral thigh pain (Primary)        Impression, Medical Decision Making, Progress Notes and Critical Care    Impression, Differential Diagnosis and Plan of Care  Adam Farrell is a 53 y.o. male with a history of of colon cancer (in remission, s/p hartman procedure), HTN, T2DM, migraines who presents for 4 days of sudden onset bilateral thigh pain and burning, worse at night.  Vital signs are notable for tachycardia 110 BPM. On exam, the patient is in no acute distress.  He has subjective tenderness palpation to his anterior thighs.  Bilateral lower extremity strength is 5/5 at the hip, knee.  He is having no disability with walking.  No midline spinal tenderness.  Ddx: Electrolyte abnormalities such as elevated glucose as he has history of this is not taking his metformin versus rhabdomyolysis for unknown reason.  Does not appear to be any drug-related causes or activity related.  Low suspicion for malignancy with bilateral legs being affected and no evidence of continued disease on recent CT chest abdomen pelvis.  Plan to obtain CK, CRP, ESR, CBC and CMP.   ED Course as of 01/02/24 1919  Sat Jan 02, 2024  8145 Patient's laboratory workup was fairly unremarkable with exception of his increased blood concentrations and elevated glucose.  It is possible that he is mildly dehydrated causing his symptoms.    Portions of this record have been created using Scientist, clinical (histocompatibility and immunogenetics). Dictation errors have been sought, but may not have been identified and corrected.  See chart and resident provider documentation for details.  ____________________________________________      History     Reason for Visit Leg Pain    HPI  Adam Farrell is a 53 y.o. male with a history of of colon cancer (in remission, s/p hartman procedure), HTN, T2DM, migraines who  presents for 4 days of sudden onset bilateral thigh pain and burning. Because of this, he has had some pain while ambulating but is still able to walk normally. His pain is worse at night compared to during the day. No history of similar. He has tried salonpas and BC with little relief. He also endorses left knee swelling but notes this is somewhat chronic for him. He denies any new exercises or new medications. He denies any weakness or muscle spasms. Of note, he has not taken his metformin in a few months. Denies fevers, dysuria, urinary retention.   External Records Reviewed [facility] note 12/13/21 for PMH  Past Medical History[1]  Problem List[2]  Past Surgical History[3]  No current facility-administered medications for this encounter. No current outpatient medications on file.  Allergies Chocolate flavor, Atorvastatin, and Rosuvastatin  Family History[4]  Social History Short Social History[5]    Physical Exam   ED Triage Vitals  Enc Vitals Group     BP 01/02/24 1446 (!) 157/104     Pulse 01/02/24 1446 110     SpO2 Pulse --      Resp 01/02/24 1446 16     Temp 01/02/24 1446 36.8 C (98.2 F)     Temp Source 01/02/24 1446 Temporal     SpO2 01/02/24 1446 97 %     Weight 01/02/24 1438 86 kg (189 lb 9.6 oz)   Constitutional: Alert and oriented. Well appearing and in no distress. Eyes: Conjunctivae are normal. Skin: Skin is warm, dry and intact.  No rash noted. Psychiatric: Mood and affect are normal. Speech and behavior are normal.    Radiology   No orders to display     Procedures    Documentation assistance was provided by Virgel Pottier, Scribe on January 02, 2024 at 3:56 PM for Donnice Lee, PA.  January 02, 2024 6:53 PM. Documentation assistance provided by the scribe. I was present during the time the encounter was recorded. The information recorded by the scribe was done at my direction and has been reviewed and validated by me.           [1] Past Medical History: Diagnosis Date   Goals of care, counseling/discussion 01/23/2018   Hypocalcemia 06/19/2018   Low vitamin B12 level 05/24/2018   Other fatigue 05/23/2018  [2] Patient Active Problem List Diagnosis   Essential hypertension   Controlled type 2 diabetes mellitus without complication, without long-term current use of insulin  (CMS-HCC)   Hypercholesterolemia   Malignant neoplasm of colon (CMS-HCC)   Mass of left hand   Normocytic anemia  [3] No past surgical history on file. [4] No family history on file. [5] Social History Tobacco Use   Smoking status: Former    Types: Cigarettes   Smokeless tobacco: Never  Vaping Use   Vaping status: Never Used  Substance Use Topics   Alcohol use: Yes    Comment: rare   Drug use: Never   Lee Donnice [place], [place] 01/02/24 1919

## 2024-01-04 ENCOUNTER — Emergency Department
Admission: EM | Admit: 2024-01-04 | Discharge: 2024-01-04 | Disposition: A | Discharge status: Home / Self Care | Attending: Emergency Medicine | Admitting: Emergency Medicine

## 2024-01-04 ENCOUNTER — Emergency Department

## 2024-01-04 ENCOUNTER — Other Ambulatory Visit: Payer: Self-pay

## 2024-01-04 DIAGNOSIS — E1122 Type 2 diabetes mellitus with diabetic chronic kidney disease: Secondary | ICD-10-CM | POA: Diagnosis not present

## 2024-01-04 DIAGNOSIS — M79605 Pain in left leg: Secondary | ICD-10-CM | POA: Diagnosis not present

## 2024-01-04 DIAGNOSIS — N189 Chronic kidney disease, unspecified: Secondary | ICD-10-CM | POA: Diagnosis not present

## 2024-01-04 DIAGNOSIS — M79604 Pain in right leg: Secondary | ICD-10-CM | POA: Diagnosis present

## 2024-01-04 DIAGNOSIS — E1165 Type 2 diabetes mellitus with hyperglycemia: Secondary | ICD-10-CM | POA: Insufficient documentation

## 2024-01-04 DIAGNOSIS — I129 Hypertensive chronic kidney disease with stage 1 through stage 4 chronic kidney disease, or unspecified chronic kidney disease: Secondary | ICD-10-CM | POA: Insufficient documentation

## 2024-01-04 DIAGNOSIS — Z85038 Personal history of other malignant neoplasm of large intestine: Secondary | ICD-10-CM | POA: Diagnosis not present

## 2024-01-04 DIAGNOSIS — R739 Hyperglycemia, unspecified: Secondary | ICD-10-CM

## 2024-01-04 LAB — CBC
HCT: 45.1 % (ref 39.0–52.0)
Hemoglobin: 15.7 g/dL (ref 13.0–17.0)
MCH: 28.8 pg (ref 26.0–34.0)
MCHC: 34.8 g/dL (ref 30.0–36.0)
MCV: 82.8 fL (ref 80.0–100.0)
Platelets: 160 K/uL (ref 150–400)
RBC: 5.45 MIL/uL (ref 4.22–5.81)
RDW: 11.9 % (ref 11.5–15.5)
WBC: 6 K/uL (ref 4.0–10.5)
nRBC: 0 % (ref 0.0–0.2)

## 2024-01-04 LAB — BASIC METABOLIC PANEL WITH GFR
Anion gap: 13 (ref 5–15)
BUN: 21 mg/dL — ABNORMAL HIGH (ref 6–20)
CO2: 22 mmol/L (ref 22–32)
Calcium: 8.5 mg/dL — ABNORMAL LOW (ref 8.9–10.3)
Chloride: 99 mmol/L (ref 98–111)
Creatinine, Ser: 1.02 mg/dL (ref 0.61–1.24)
GFR, Estimated: >60 mL/min
Glucose, Bld: 289 mg/dL — ABNORMAL HIGH (ref 70–99)
Potassium: 4.3 mmol/L (ref 3.5–5.1)
Sodium: 133 mmol/L — ABNORMAL LOW (ref 135–145)

## 2024-01-04 LAB — CK: Total CK: 63 U/L (ref 49–397)

## 2024-01-04 LAB — MAGNESIUM: Magnesium: 2 mg/dL (ref 1.7–2.4)

## 2024-01-04 MED ORDER — CYCLOBENZAPRINE HCL 10 MG PO TABS
5.0000 mg | ORAL_TABLET | Freq: Once | ORAL | Status: AC
  Administered 2024-01-04: 5 mg via ORAL
  Filled 2024-01-04: qty 1

## 2024-01-04 MED ORDER — KETOROLAC TROMETHAMINE 30 MG/ML IJ SOLN
15.0000 mg | Freq: Once | INTRAMUSCULAR | Status: AC
  Administered 2024-01-04: 15 mg via INTRAVENOUS
  Filled 2024-01-04: qty 1

## 2024-01-04 MED ORDER — CYCLOBENZAPRINE HCL 5 MG PO TABS: 5.0000 mg | ORAL_TABLET | Freq: Every day | ORAL | 0 refills | Status: AC

## 2024-01-04 MED ORDER — IOHEXOL 300 MG/ML  SOLN
100.0000 mL | Freq: Once | INTRAMUSCULAR | Status: AC | PRN
  Administered 2024-01-04: 100 mL via INTRAVENOUS

## 2024-01-04 NOTE — ED Triage Notes (Signed)
 Pt to ed from home via POV for bilateral upper thigh pain x 2 days. Pt was seen at [facility] yesterday and was told he was dehydrated. Pt is caox4, in no acute distress and ambulatory in triage.

## 2024-01-04 NOTE — ED Provider Notes (Signed)
 "  [facility] Provider Note    Event Date/Time   First MD Initiated Contact with Patient 01/04/24 [phone]     (approximate)   History   Leg Pain (bilateral)   HPI Adam Farrell is a 53 y.o. male whose medical history is notable for colon cancer treated about 6 years ago as well as history of intermittent electrolyte abnormalities, diabetes, and hypertension as well as some chronic kidney disease.  He presents for evaluation of several days of pain in both of his legs that started around his knees and has been radiating up.  He said now it comes up to the top of his thighs and even sometimes into his lower back.  He went to [facility] yesterday and was told that he is probably dehydrated and hyperglycemic which is causing the symptoms.  He got some fluids and felt better for a time but now his legs are aching badly enough that he cannot sleep.  It is always worse at night.  He has no weakness or numbness.  No recent trauma, exercise or exertion to extreme, nor new medications.     Physical Exam   Triage Vital Signs: ED Triage Vitals [01/04/24 0342]  Encounter Vitals Group     BP (!) 148/96     Girls Systolic BP Percentile      Girls Diastolic BP Percentile      Boys Systolic BP Percentile      Boys Diastolic BP Percentile      Pulse Rate 92     Resp 16     Temp 98 F (36.7 C)     Temp Source Oral     SpO2 98 %     Weight      Height 1.702 m (5' 7)     Head Circumference      Peak Flow      Pain Score 10     Pain Loc      Pain Education      Exclude from Growth Chart     Most recent vital signs: Vitals:   01/04/24 0342 01/04/24 0648  BP: (!) 148/96 (!) 131/91  Pulse: 92 87  Resp: 16   Temp: 98 F (36.7 C)   SpO2: 98% 100%    General: Awake, no distress but appears uncomfortable. CV:  Good peripheral perfusion.  Resp:  Normal effort. Speaking easily and comfortably, no accessory muscle usage nor intercostal retractions.   Abd:  No  distention.  No tenderness to palpation of the abdomen. Other:  No peripheral edema.  Compartments are soft and easily compressible throughout his lower legs and thighs.  No reproducible tenderness to palpation.   ED Results / Procedures / Treatments   Labs (all labs ordered are listed, but only abnormal results are displayed) Labs Reviewed  BASIC METABOLIC PANEL WITH GFR - Abnormal; Notable for the following components:      Result Value   Sodium 133 (*)    Glucose, Bld 289 (*)    BUN 21 (*)    Calcium 8.5 (*)    All other components within normal limits  CK  CBC  MAGNESIUM       RADIOLOGY See ED course for details   PROCEDURES:  Critical Care performed: No  Procedures    IMPRESSION / MDM / ASSESSMENT AND PLAN / ED COURSE  I reviewed the triage vital signs and the nursing notes.  Differential diagnosis includes, but is not limited to, dehydration, electrolyte disturbance, rhabdomyolysis, DVT, less likely lumbar radiculopathy.  Patient's presentation is most consistent with acute presentation with potential threat to life or bodily function.  Labs/studies ordered: CBC, magnesium, BMP, CK, bilateral venous ultrasound  Interventions/Medications given:  Medications  ketorolac  (TORADOL ) 30 MG/ML injection 15 mg (15 mg Intravenous Given 01/04/24 0417)  cyclobenzaprine  (FLEXERIL ) tablet 5 mg (5 mg Oral Given 01/04/24 0417)  iohexol  (OMNIPAQUE ) 300 MG/ML solution 100 mL (100 mLs Intravenous Contrast Given 01/04/24 0513)    (Note:  hospital course my include additional interventions and/or labs/studies not listed above.)   We will provide some IV fluids and I ordered a dose of Flexeril  5 mg p.o. and Toradol  15 mg IV to help with the pain.  I will evaluate with an ultrasound to rule out DVT and his labs are pending.  No evidence of compartment syndrome or infection     Clinical Course as of 01/04/24 0655  Mon Jan 04, 2024  9561  Patient's calcium is a bit low and his sodium is a bit low, but otherwise his labs are fairly reassuring, glucose is high but better than it was at [facility] [CF]  9554 Given the patient's history of cancer and the symptoms that are somewhat confusing but they could also be representative of a lumbar radiculopathy if he is having possibly a metastatic issue or recurrence of cancer that is pressing on a nerve.  I spoke with the patient and we will proceed with CT of the abdomen and pelvis with IV contrast with a lumbar spine recon [CF]  9347 US  Venous Img Lower Bilateral I independently viewed and interpreted the patient's ultrasound I see no evidence of DVT.  I also read the radiologist's report, which confirmed no acute findings. [CF]  [id number] I independently viewed and interpreted the patient's abd/pelvis CT, as well as reviewing the radiologist's report.  I also independently viewed the L-spine CT.  I do not see any evidence of any acute abnormalities such as metastases or lytic lesions.  Radiology confirms no acute findings. [CF]  [phone] I reassessed the patient and he is feeling better with no pain right now.  The Flexeril  seems to have helped.  I explained no evidence of any acute or emergent abnormality and he will follow-up with his primary care provider.  Prescriptions listed below.  I gave my usual customary follow-up recommendations and return precautions. [CF]    Clinical Course User Index [CF] Gordan Huxley, MD     FINAL CLINICAL IMPRESSION(S) / ED DIAGNOSES   Final diagnoses:  Bilateral leg pain  Hyperglycemia     Rx / DC Orders   ED Discharge Orders          Ordered    cyclobenzaprine  (FLEXERIL ) 5 MG tablet  Daily at bedtime        01/04/24 9360             Note:  This document was prepared using Dragon voice recognition software and may include unintentional dictation errors.   Gordan Huxley, MD 01/04/24 [phone]  "

## 2024-01-04 NOTE — Discharge Instructions (Signed)
 Your workup in the Emergency Department today was reassuring.  We did not find any specific abnormalities.  We recommend you drink plenty of fluids, take your regular medications and/or any new ones prescribed today, and follow up with the doctor(s) listed in these documents as recommended.  Return to the Emergency Department if you develop new or worsening symptoms that concern you.
